# Patient Record
Sex: Male | Born: 1975 | Race: Black or African American | Hispanic: No | Marital: Married | Smoking: Never smoker
Health system: Southern US, Community
[De-identification: ages and names within clinical notes are randomized; demographics above are authoritative.]

## PROBLEM LIST (undated history)

## (undated) DIAGNOSIS — E119 Type 2 diabetes mellitus without complications: Secondary | ICD-10-CM

## (undated) DIAGNOSIS — E782 Mixed hyperlipidemia: Secondary | ICD-10-CM

## (undated) DIAGNOSIS — Z973 Presence of spectacles and contact lenses: Secondary | ICD-10-CM

## (undated) DIAGNOSIS — I10 Essential (primary) hypertension: Secondary | ICD-10-CM

## (undated) DIAGNOSIS — E1122 Type 2 diabetes mellitus with diabetic chronic kidney disease: Secondary | ICD-10-CM

## (undated) DIAGNOSIS — E7800 Pure hypercholesterolemia, unspecified: Secondary | ICD-10-CM

## (undated) DIAGNOSIS — N481 Balanitis: Secondary | ICD-10-CM

## (undated) HISTORY — PX: NO PAST SURGERIES: SHX2092

---

## 1997-04-27 HISTORY — PX: WISDOM TOOTH EXTRACTION: SHX21

## 2003-07-02 ENCOUNTER — Emergency Department (HOSPITAL_COMMUNITY): Admission: EM | Admit: 2003-07-02 | Discharge: 2003-07-02 | Payer: Self-pay | Admitting: Emergency Medicine

## 2006-05-02 ENCOUNTER — Emergency Department (HOSPITAL_COMMUNITY): Admission: EM | Admit: 2006-05-02 | Discharge: 2006-05-02 | Payer: Self-pay | Admitting: Emergency Medicine

## 2013-06-20 ENCOUNTER — Emergency Department (HOSPITAL_COMMUNITY)
Admission: EM | Admit: 2013-06-20 | Discharge: 2013-06-20 | Disposition: A | Discharge status: Home / Self Care | Payer: 59 | Attending: Emergency Medicine | Admitting: Emergency Medicine

## 2013-06-20 ENCOUNTER — Emergency Department (HOSPITAL_COMMUNITY): Payer: 59

## 2013-06-20 ENCOUNTER — Encounter (HOSPITAL_COMMUNITY): Payer: Self-pay | Admitting: Emergency Medicine

## 2013-06-20 DIAGNOSIS — E7800 Pure hypercholesterolemia, unspecified: Secondary | ICD-10-CM | POA: Insufficient documentation

## 2013-06-20 DIAGNOSIS — R05 Cough: Secondary | ICD-10-CM | POA: Insufficient documentation

## 2013-06-20 DIAGNOSIS — R059 Cough, unspecified: Secondary | ICD-10-CM | POA: Insufficient documentation

## 2013-06-20 DIAGNOSIS — R079 Chest pain, unspecified: Secondary | ICD-10-CM | POA: Insufficient documentation

## 2013-06-20 DIAGNOSIS — E119 Type 2 diabetes mellitus without complications: Secondary | ICD-10-CM | POA: Insufficient documentation

## 2013-06-20 DIAGNOSIS — Z7982 Long term (current) use of aspirin: Secondary | ICD-10-CM | POA: Insufficient documentation

## 2013-06-20 DIAGNOSIS — E785 Hyperlipidemia, unspecified: Secondary | ICD-10-CM | POA: Insufficient documentation

## 2013-06-20 DIAGNOSIS — Z79899 Other long term (current) drug therapy: Secondary | ICD-10-CM | POA: Insufficient documentation

## 2013-06-20 HISTORY — DX: Type 2 diabetes mellitus without complications: E11.9

## 2013-06-20 HISTORY — DX: Pure hypercholesterolemia, unspecified: E78.00

## 2013-06-20 LAB — I-STAT TROPONIN, ED
Troponin i, poc: 0 ng/mL (ref 0.00–0.08)
Troponin i, poc: 0.01 ng/mL (ref 0.00–0.08)

## 2013-06-20 LAB — COMPREHENSIVE METABOLIC PANEL
ALK PHOS: 73 U/L (ref 39–117)
ALT: 43 U/L (ref 0–53)
AST: 30 U/L (ref 0–37)
Albumin: 3.9 g/dL (ref 3.5–5.2)
BUN: 13 mg/dL (ref 6–23)
CO2: 23 mEq/L (ref 19–32)
Calcium: 9.2 mg/dL (ref 8.4–10.5)
Chloride: 102 mEq/L (ref 96–112)
Creatinine, Ser: 0.86 mg/dL (ref 0.50–1.35)
GFR calc non Af Amer: >90 mL/min (ref >90)
GLUCOSE: 129 mg/dL — AB (ref 70–99)
POTASSIUM: 4.7 meq/L (ref 3.7–5.3)
SODIUM: 140 meq/L (ref 137–147)
TOTAL PROTEIN: 7.7 g/dL (ref 6.0–8.3)
Total Bilirubin: 0.2 mg/dL — ABNORMAL LOW (ref 0.3–1.2)

## 2013-06-20 LAB — CBC WITH DIFFERENTIAL/PLATELET
BASOS ABS: 0.1 10*3/uL (ref 0.0–0.1)
Basophils Relative: 1 % (ref 0–1)
EOS ABS: 0.4 10*3/uL (ref 0.0–0.7)
Eosinophils Relative: 4 % (ref 0–5)
HEMATOCRIT: 41.6 % (ref 39.0–52.0)
HEMOGLOBIN: 13.8 g/dL (ref 13.0–17.0)
Lymphocytes Relative: 19 % (ref 12–46)
Lymphs Abs: 1.7 10*3/uL (ref 0.7–4.0)
MCH: 25.7 pg — AB (ref 26.0–34.0)
MCHC: 33.2 g/dL (ref 30.0–36.0)
MCV: 77.6 fL — AB (ref 78.0–100.0)
MONOS PCT: 7 % (ref 3–12)
Monocytes Absolute: 0.7 10*3/uL (ref 0.1–1.0)
NEUTROS ABS: 6 10*3/uL (ref 1.7–7.7)
Neutrophils Relative %: 68 % (ref 43–77)
Platelets: 308 10*3/uL (ref 150–400)
RBC: 5.36 MIL/uL (ref 4.22–5.81)
RDW: 15.3 % (ref 11.5–15.5)
WBC: 8.8 10*3/uL (ref 4.0–10.5)

## 2013-06-20 LAB — CK TOTAL AND CKMB (NOT AT ARMC)
CK TOTAL: 226 U/L (ref 7–232)
CK, MB: 2 ng/mL (ref 0.3–4.0)
Relative Index: 0.9 (ref 0.0–2.5)

## 2013-06-20 MED ORDER — IPRATROPIUM BROMIDE 0.02 % IN SOLN
0.5000 mg | Freq: Once | RESPIRATORY_TRACT | Status: AC
  Administered 2013-06-20: 0.5 mg via RESPIRATORY_TRACT
  Filled 2013-06-20: qty 2.5

## 2013-06-20 MED ORDER — SODIUM CHLORIDE 0.9 % IV BOLUS (SEPSIS)
1000.0000 mL | Freq: Once | INTRAVENOUS | Status: AC
  Administered 2013-06-20: 1000 mL via INTRAVENOUS

## 2013-06-20 MED ORDER — KETOROLAC TROMETHAMINE 30 MG/ML IJ SOLN
30.0000 mg | Freq: Once | INTRAMUSCULAR | Status: AC
  Administered 2013-06-20: 30 mg via INTRAVENOUS
  Filled 2013-06-20: qty 1

## 2013-06-20 MED ORDER — ALBUTEROL SULFATE (2.5 MG/3ML) 0.083% IN NEBU
5.0000 mg | INHALATION_SOLUTION | Freq: Once | RESPIRATORY_TRACT | Status: AC
  Administered 2013-06-20: 5 mg via RESPIRATORY_TRACT
  Filled 2013-06-20: qty 6

## 2013-06-20 NOTE — ED Notes (Addendum)
To ED via [facility] from home with chest pain that started last night at 2am== woke patient up, but was able to go back to sleep. Was still present at 5:30am-- took a shower, then had an episode of coughing. Pt took ASA 325mg  x 2 this am. Received NTG x 3per EMS-- brought pain from 5/10 to 4/10. IV Left Hand-- 20 G per EMS

## 2013-06-20 NOTE — ED Notes (Signed)
MD at bedside. 

## 2013-06-20 NOTE — ED Provider Notes (Signed)
CSN: [id number]     Arrival date & time 06/20/13  0815 History   First MD Initiated Contact with Patient 06/20/13 0818     Chief Complaint  Patient presents with  . Chest Pain     (Consider location/radiation/quality/duration/timing/severity/associated sxs/prior Treatment) The history is provided by the patient.  Jene EveryJeremy L Dismuke is a 38 y.o. male hx of prediabetes not on meds, HL here with chest pain. Substernal chest pain, dull ache, since 2 am this morning. It woke him up from sleep. Took ASA 325 mg x 2 with minimal relief. Has some coughing afterwards. Denies fevers. Denies abdominal pain or vomiting. Given nitro by EMS and pain is now 4/10. Denies smoking, no hx of CAD.     Past Medical History  Diagnosis Date  . Diabetes     type II-- controlled with diet  . High cholesterol    History reviewed. No pertinent past surgical history. History reviewed. No pertinent family history. History  Substance Use Topics  . Smoking status: Never Smoker   . Smokeless tobacco: Not on file  . Alcohol Use: No    Review of Systems  Cardiovascular: Positive for chest pain.  All other systems reviewed and are negative.      Allergies  Review of patient's allergies indicates no known allergies.  Home Medications   Current Outpatient Rx  Name  Route  Sig  Dispense  Refill  . aspirin EC 81 MG tablet   Oral   Take 81 mg by mouth at bedtime.         Marland Kitchen. atorvastatin (LIPITOR) 20 MG tablet   Oral   Take 20 mg by mouth every evening.         . Omega-3 Fatty Acids (FISH OIL) 1000 MG CAPS   Oral   Take 1,000 mg by mouth 3 (three) times daily with meals.          BP 126/74  Pulse 80  Temp(Src) 98.3 F (36.8 C) (Oral)  Resp 23  SpO2 97% Physical Exam  Nursing note and vitals reviewed. Constitutional: He is oriented to person, place, and time. He appears well-developed and well-nourished.  Slightly uncomfortable   HENT:  Head: Normocephalic.  Mouth/Throat: Oropharynx is  clear and moist.  Eyes: Conjunctivae are normal. Pupils are equal, round, and reactive to light.  Neck: Normal range of motion. Neck supple.  Cardiovascular: Normal rate, regular rhythm and normal heart sounds.   Pulmonary/Chest: Effort normal.  Decreased air movement. ? Upper lobe wheezing, no crackles   Abdominal: Soft. Bowel sounds are normal. He exhibits no distension. There is no tenderness. There is no rebound and no guarding.  Musculoskeletal: Normal range of motion. He exhibits no edema and no tenderness.  Neurological: He is alert and oriented to person, place, and time. No cranial nerve deficit. Coordination normal.  Skin: Skin is warm and dry.  Psychiatric: He has a normal mood and affect. His behavior is normal. Judgment and thought content normal.    ED Course  Procedures (including critical care time)    EMERGENCY DEPARTMENT US CARDIAC EXAM "Study: Limited Ultrasound of the heart and pericardium"  INDICATIONS:Dyspnea Multiple views of the heart and pericardium are obtained with a multi-frequency probe.  PERFORMED WU:[id number]:Myself  IMAGES ARCHIVED?: Yes  FINDINGS: No pericardial effusion and Normal contractility  LIMITATIONS:  Body habitus  VIEWS USED: Subcostal 4 chamber, Parasternal long axis and Parasternal short axis  INTERPRETATION: Cardiac activity present and Pericardial effusioin absent  COMMENT:  No  pericardial effusion    Labs Review Labs Reviewed  CBC WITH DIFFERENTIAL - Abnormal; Notable for the following:    MCV 77.6 (*)    MCH 25.7 (*)    All other components within normal limits  COMPREHENSIVE METABOLIC PANEL - Abnormal; Notable for the following:    Glucose, Bld 129 (*)    Total Bilirubin 0.2 (*)    All other components within normal limits  CK TOTAL AND CKMB  I-STAT TROPOININ, ED  I-STAT TROPOININ, ED   Imaging Review Dg Chest Portable 1 View  06/20/2013   CLINICAL DATA:  Chest pain, shortness of Breath  EXAM: PORTABLE CHEST - 1 VIEW   COMPARISON:  None.  FINDINGS: Cardiomediastinal silhouette is unremarkable. No acute infiltrate or pleural effusion. No pulmonary edema. Bony thorax is unremarkable.  IMPRESSION: No active disease.   Electronically Signed   By: Natasha Mead M.D.   On: 06/20/2013 08:43    EKG Interpretation    Date/Time:  Tuesday June 20 2013 08:21:56 EST Ventricular Rate:  96 PR Interval:  163 QRS Duration: 88 QT Interval:  355 QTC Calculation: 449 R Axis:   36 Text Interpretation:  Sinus rhythm Borderline T abnormalities, diffuse leads No previous ECGs available Confirmed by YAO  MD, DAVID [phone]) on 06/20/2013 8:24:50 AM            MDM   Final diagnoses:  None   KRISTION HOLIFIELD is a 38 y.o. male here with chest pain. Low risk for ACS. PERC neg for PE. EKG showed diffuse TWI. No pericardial rub but I am considering possible pericarditis. Also mild wheezing so bronchitis possible. Will get labs, CK, trop x 2, nebs, cxr.   9 AM  US showed no pericardial effusion.   12:26 PM Pain free after toradol. Trop neg x 2. CK nl. I doubt ACS. Can be mild pericarditis. Recommend NSAIDs and he has f/u outpatient.     Richardean Canal, MD 06/20/13 [phone]

## 2013-06-20 NOTE — Discharge Instructions (Signed)
Take motrin 800 mg every 6 hrs for pain.   Follow up with your doctor. You may need further testing if you still have pain in several days.   Return to ER if you have fever, severe pain, shortness of breath.

## 2015-08-09 IMAGING — DX DG CHEST 1V PORT
1 series · 1 of 1 positions shown · non-contrast
Comparison: None.

CLINICAL DATA: Chest pain, shortness of Breath

EXAM:
PORTABLE CHEST - 1 VIEW

[portable]
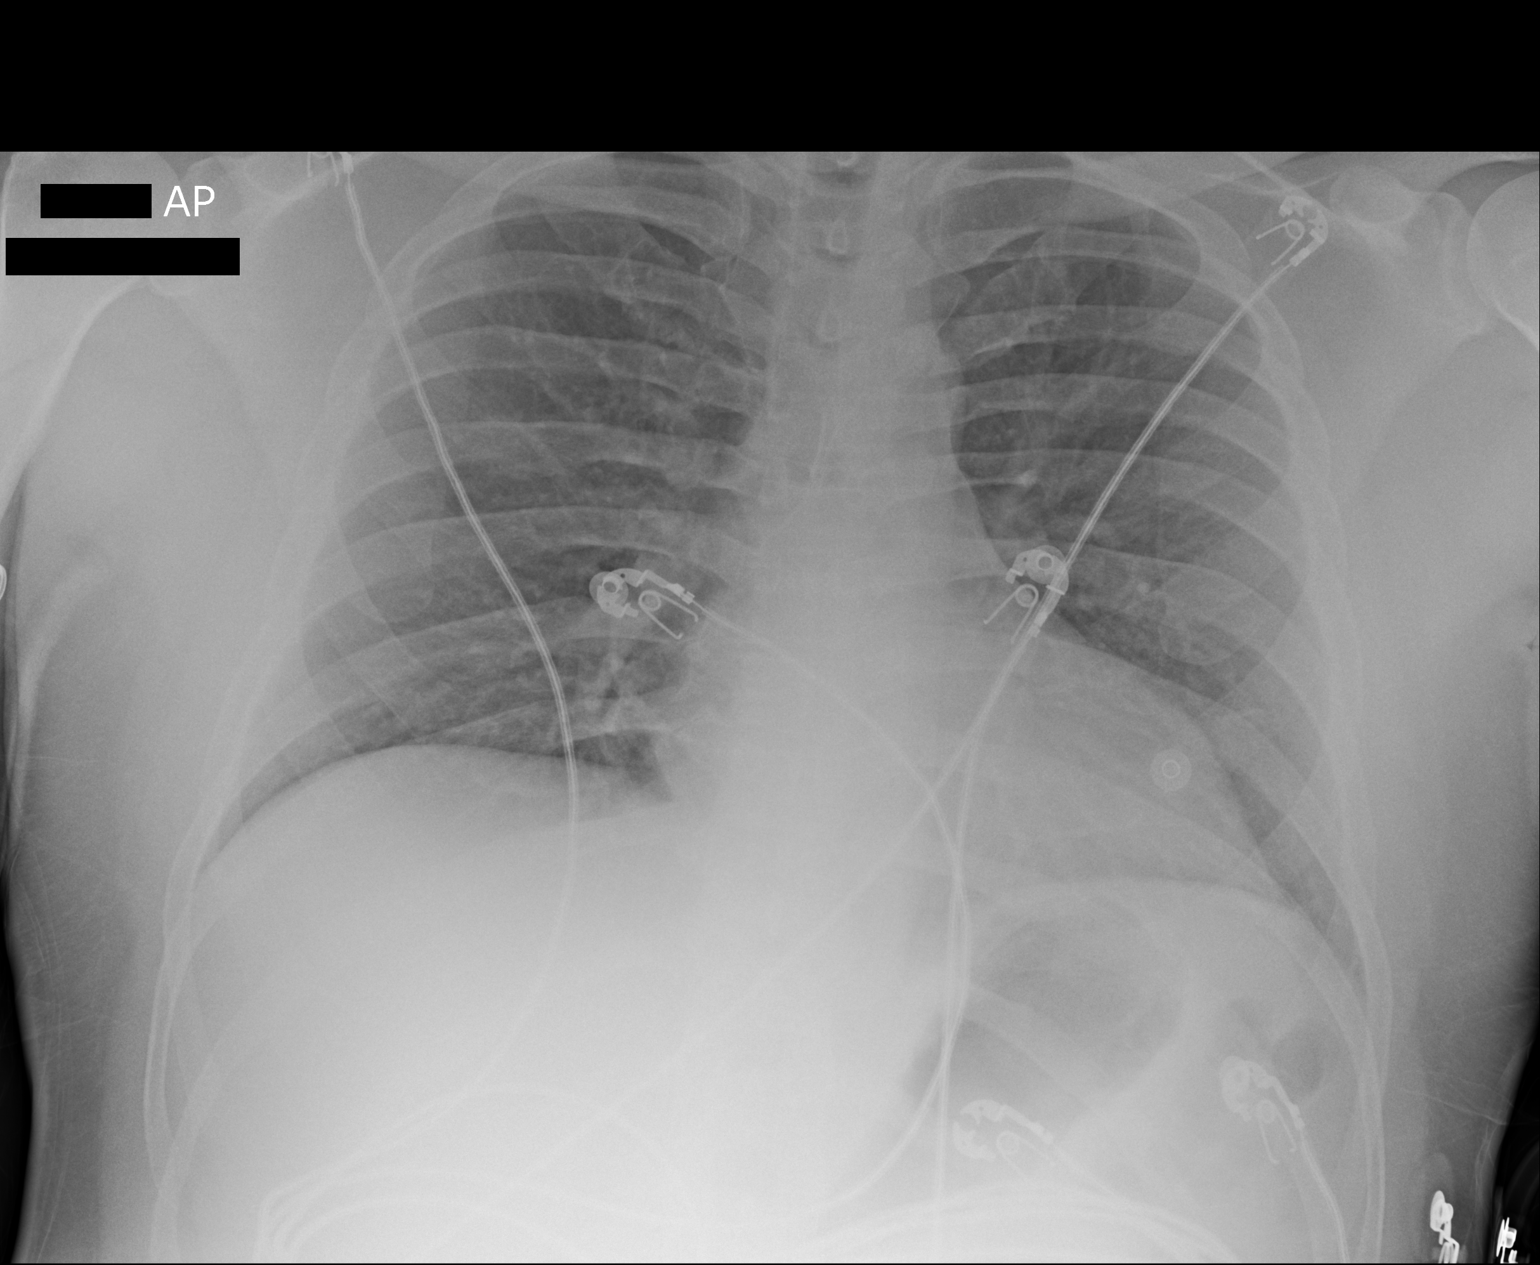

[1 of 1 positions shown; findings below may reference images not displayed]

FINDINGS: Cardiomediastinal silhouette is unremarkable. No acute infiltrate or
pleural effusion. No pulmonary edema. Bony thorax is unremarkable.
IMPRESSION: No active disease.

## 2016-11-02 DIAGNOSIS — E782 Mixed hyperlipidemia: Secondary | ICD-10-CM | POA: Diagnosis not present

## 2016-11-02 DIAGNOSIS — I10 Essential (primary) hypertension: Secondary | ICD-10-CM | POA: Diagnosis not present

## 2016-11-02 DIAGNOSIS — Z79899 Other long term (current) drug therapy: Secondary | ICD-10-CM | POA: Diagnosis not present

## 2016-11-02 DIAGNOSIS — E113293 Type 2 diabetes mellitus with mild nonproliferative diabetic retinopathy without macular edema, bilateral: Secondary | ICD-10-CM | POA: Diagnosis not present

## 2016-11-02 DIAGNOSIS — E113291 Type 2 diabetes mellitus with mild nonproliferative diabetic retinopathy without macular edema, right eye: Secondary | ICD-10-CM | POA: Diagnosis not present

## 2016-11-26 DIAGNOSIS — E1122 Type 2 diabetes mellitus with diabetic chronic kidney disease: Secondary | ICD-10-CM | POA: Diagnosis not present

## 2017-02-11 DIAGNOSIS — E1165 Type 2 diabetes mellitus with hyperglycemia: Secondary | ICD-10-CM | POA: Diagnosis not present

## 2017-02-11 DIAGNOSIS — E1122 Type 2 diabetes mellitus with diabetic chronic kidney disease: Secondary | ICD-10-CM | POA: Diagnosis not present

## 2017-02-11 DIAGNOSIS — I10 Essential (primary) hypertension: Secondary | ICD-10-CM | POA: Diagnosis not present

## 2017-02-11 DIAGNOSIS — E782 Mixed hyperlipidemia: Secondary | ICD-10-CM | POA: Diagnosis not present

## 2017-05-18 DIAGNOSIS — I10 Essential (primary) hypertension: Secondary | ICD-10-CM | POA: Diagnosis not present

## 2017-07-12 DIAGNOSIS — E1165 Type 2 diabetes mellitus with hyperglycemia: Secondary | ICD-10-CM | POA: Diagnosis not present

## 2017-07-12 DIAGNOSIS — E1122 Type 2 diabetes mellitus with diabetic chronic kidney disease: Secondary | ICD-10-CM | POA: Diagnosis not present

## 2017-08-25 DIAGNOSIS — F3289 Other specified depressive episodes: Secondary | ICD-10-CM | POA: Diagnosis not present

## 2017-08-25 DIAGNOSIS — F438 Other reactions to severe stress: Secondary | ICD-10-CM | POA: Diagnosis not present

## 2017-08-25 DIAGNOSIS — F419 Anxiety disorder, unspecified: Secondary | ICD-10-CM | POA: Diagnosis not present

## 2017-09-02 DIAGNOSIS — F419 Anxiety disorder, unspecified: Secondary | ICD-10-CM | POA: Diagnosis not present

## 2017-09-02 DIAGNOSIS — F438 Other reactions to severe stress: Secondary | ICD-10-CM | POA: Diagnosis not present

## 2017-09-02 DIAGNOSIS — F3289 Other specified depressive episodes: Secondary | ICD-10-CM | POA: Diagnosis not present

## 2017-09-23 DIAGNOSIS — F419 Anxiety disorder, unspecified: Secondary | ICD-10-CM | POA: Diagnosis not present

## 2017-09-23 DIAGNOSIS — F438 Other reactions to severe stress: Secondary | ICD-10-CM | POA: Diagnosis not present

## 2017-09-23 DIAGNOSIS — F3289 Other specified depressive episodes: Secondary | ICD-10-CM | POA: Diagnosis not present

## 2017-10-07 DIAGNOSIS — F3289 Other specified depressive episodes: Secondary | ICD-10-CM | POA: Diagnosis not present

## 2017-10-07 DIAGNOSIS — F438 Other reactions to severe stress: Secondary | ICD-10-CM | POA: Diagnosis not present

## 2017-10-07 DIAGNOSIS — F419 Anxiety disorder, unspecified: Secondary | ICD-10-CM | POA: Diagnosis not present

## 2017-10-14 DIAGNOSIS — F438 Other reactions to severe stress: Secondary | ICD-10-CM | POA: Diagnosis not present

## 2017-10-14 DIAGNOSIS — F419 Anxiety disorder, unspecified: Secondary | ICD-10-CM | POA: Diagnosis not present

## 2017-10-14 DIAGNOSIS — F3289 Other specified depressive episodes: Secondary | ICD-10-CM | POA: Diagnosis not present

## 2017-10-29 DIAGNOSIS — F3289 Other specified depressive episodes: Secondary | ICD-10-CM | POA: Diagnosis not present

## 2017-10-29 DIAGNOSIS — F438 Other reactions to severe stress: Secondary | ICD-10-CM | POA: Diagnosis not present

## 2017-10-29 DIAGNOSIS — F419 Anxiety disorder, unspecified: Secondary | ICD-10-CM | POA: Diagnosis not present

## 2017-11-05 DIAGNOSIS — F3289 Other specified depressive episodes: Secondary | ICD-10-CM | POA: Diagnosis not present

## 2017-11-05 DIAGNOSIS — F438 Other reactions to severe stress: Secondary | ICD-10-CM | POA: Diagnosis not present

## 2017-11-05 DIAGNOSIS — F419 Anxiety disorder, unspecified: Secondary | ICD-10-CM | POA: Diagnosis not present

## 2017-11-12 DIAGNOSIS — F438 Other reactions to severe stress: Secondary | ICD-10-CM | POA: Diagnosis not present

## 2017-11-12 DIAGNOSIS — F3289 Other specified depressive episodes: Secondary | ICD-10-CM | POA: Diagnosis not present

## 2017-11-12 DIAGNOSIS — F419 Anxiety disorder, unspecified: Secondary | ICD-10-CM | POA: Diagnosis not present

## 2017-11-18 DIAGNOSIS — F419 Anxiety disorder, unspecified: Secondary | ICD-10-CM | POA: Diagnosis not present

## 2017-11-18 DIAGNOSIS — F3289 Other specified depressive episodes: Secondary | ICD-10-CM | POA: Diagnosis not present

## 2017-11-18 DIAGNOSIS — F438 Other reactions to severe stress: Secondary | ICD-10-CM | POA: Diagnosis not present

## 2017-12-14 DIAGNOSIS — E1122 Type 2 diabetes mellitus with diabetic chronic kidney disease: Secondary | ICD-10-CM | POA: Diagnosis not present

## 2017-12-14 DIAGNOSIS — Z79899 Other long term (current) drug therapy: Secondary | ICD-10-CM | POA: Diagnosis not present

## 2017-12-14 DIAGNOSIS — Z23 Encounter for immunization: Secondary | ICD-10-CM | POA: Diagnosis not present

## 2017-12-14 DIAGNOSIS — E782 Mixed hyperlipidemia: Secondary | ICD-10-CM | POA: Diagnosis not present

## 2017-12-14 DIAGNOSIS — Z0000 Encounter for general adult medical examination without abnormal findings: Secondary | ICD-10-CM | POA: Diagnosis not present

## 2017-12-14 DIAGNOSIS — Z125 Encounter for screening for malignant neoplasm of prostate: Secondary | ICD-10-CM | POA: Diagnosis not present

## 2018-01-17 DIAGNOSIS — F3289 Other specified depressive episodes: Secondary | ICD-10-CM | POA: Diagnosis not present

## 2018-01-17 DIAGNOSIS — F419 Anxiety disorder, unspecified: Secondary | ICD-10-CM | POA: Diagnosis not present

## 2018-01-17 DIAGNOSIS — F438 Other reactions to severe stress: Secondary | ICD-10-CM | POA: Diagnosis not present

## 2018-01-31 DIAGNOSIS — F3289 Other specified depressive episodes: Secondary | ICD-10-CM | POA: Diagnosis not present

## 2018-01-31 DIAGNOSIS — F419 Anxiety disorder, unspecified: Secondary | ICD-10-CM | POA: Diagnosis not present

## 2018-01-31 DIAGNOSIS — F438 Other reactions to severe stress: Secondary | ICD-10-CM | POA: Diagnosis not present

## 2018-02-14 DIAGNOSIS — F3289 Other specified depressive episodes: Secondary | ICD-10-CM | POA: Diagnosis not present

## 2018-02-14 DIAGNOSIS — F438 Other reactions to severe stress: Secondary | ICD-10-CM | POA: Diagnosis not present

## 2018-02-14 DIAGNOSIS — F419 Anxiety disorder, unspecified: Secondary | ICD-10-CM | POA: Diagnosis not present

## 2018-02-28 DIAGNOSIS — F3289 Other specified depressive episodes: Secondary | ICD-10-CM | POA: Diagnosis not present

## 2018-02-28 DIAGNOSIS — F438 Other reactions to severe stress: Secondary | ICD-10-CM | POA: Diagnosis not present

## 2018-02-28 DIAGNOSIS — F419 Anxiety disorder, unspecified: Secondary | ICD-10-CM | POA: Diagnosis not present

## 2018-03-10 DIAGNOSIS — E113292 Type 2 diabetes mellitus with mild nonproliferative diabetic retinopathy without macular edema, left eye: Secondary | ICD-10-CM | POA: Diagnosis not present

## 2018-03-14 DIAGNOSIS — F3289 Other specified depressive episodes: Secondary | ICD-10-CM | POA: Diagnosis not present

## 2018-03-14 DIAGNOSIS — F419 Anxiety disorder, unspecified: Secondary | ICD-10-CM | POA: Diagnosis not present

## 2018-03-14 DIAGNOSIS — F438 Other reactions to severe stress: Secondary | ICD-10-CM | POA: Diagnosis not present

## 2018-03-29 DIAGNOSIS — F438 Other reactions to severe stress: Secondary | ICD-10-CM | POA: Diagnosis not present

## 2018-03-29 DIAGNOSIS — F3289 Other specified depressive episodes: Secondary | ICD-10-CM | POA: Diagnosis not present

## 2018-03-29 DIAGNOSIS — F419 Anxiety disorder, unspecified: Secondary | ICD-10-CM | POA: Diagnosis not present

## 2018-04-12 DIAGNOSIS — F419 Anxiety disorder, unspecified: Secondary | ICD-10-CM | POA: Diagnosis not present

## 2018-04-12 DIAGNOSIS — F3289 Other specified depressive episodes: Secondary | ICD-10-CM | POA: Diagnosis not present

## 2018-04-12 DIAGNOSIS — F438 Other reactions to severe stress: Secondary | ICD-10-CM | POA: Diagnosis not present

## 2018-05-25 DIAGNOSIS — F419 Anxiety disorder, unspecified: Secondary | ICD-10-CM | POA: Diagnosis not present

## 2018-05-25 DIAGNOSIS — F3289 Other specified depressive episodes: Secondary | ICD-10-CM | POA: Diagnosis not present

## 2018-05-25 DIAGNOSIS — F438 Other reactions to severe stress: Secondary | ICD-10-CM | POA: Diagnosis not present

## 2018-06-07 DIAGNOSIS — F3289 Other specified depressive episodes: Secondary | ICD-10-CM | POA: Diagnosis not present

## 2018-06-07 DIAGNOSIS — F419 Anxiety disorder, unspecified: Secondary | ICD-10-CM | POA: Diagnosis not present

## 2018-06-07 DIAGNOSIS — F438 Other reactions to severe stress: Secondary | ICD-10-CM | POA: Diagnosis not present

## 2018-06-20 DIAGNOSIS — F438 Other reactions to severe stress: Secondary | ICD-10-CM | POA: Diagnosis not present

## 2018-06-20 DIAGNOSIS — F419 Anxiety disorder, unspecified: Secondary | ICD-10-CM | POA: Diagnosis not present

## 2018-06-20 DIAGNOSIS — F3289 Other specified depressive episodes: Secondary | ICD-10-CM | POA: Diagnosis not present

## 2018-07-04 DIAGNOSIS — F3289 Other specified depressive episodes: Secondary | ICD-10-CM | POA: Diagnosis not present

## 2018-07-04 DIAGNOSIS — F419 Anxiety disorder, unspecified: Secondary | ICD-10-CM | POA: Diagnosis not present

## 2018-07-04 DIAGNOSIS — F438 Other reactions to severe stress: Secondary | ICD-10-CM | POA: Diagnosis not present

## 2018-07-25 DIAGNOSIS — F438 Other reactions to severe stress: Secondary | ICD-10-CM | POA: Diagnosis not present

## 2018-07-25 DIAGNOSIS — F419 Anxiety disorder, unspecified: Secondary | ICD-10-CM | POA: Diagnosis not present

## 2018-07-25 DIAGNOSIS — F3289 Other specified depressive episodes: Secondary | ICD-10-CM | POA: Diagnosis not present

## 2018-08-08 DIAGNOSIS — F3289 Other specified depressive episodes: Secondary | ICD-10-CM | POA: Diagnosis not present

## 2018-08-08 DIAGNOSIS — F419 Anxiety disorder, unspecified: Secondary | ICD-10-CM | POA: Diagnosis not present

## 2018-08-08 DIAGNOSIS — F438 Other reactions to severe stress: Secondary | ICD-10-CM | POA: Diagnosis not present

## 2018-08-23 DIAGNOSIS — F3289 Other specified depressive episodes: Secondary | ICD-10-CM | POA: Diagnosis not present

## 2018-08-23 DIAGNOSIS — F438 Other reactions to severe stress: Secondary | ICD-10-CM | POA: Diagnosis not present

## 2018-08-23 DIAGNOSIS — F419 Anxiety disorder, unspecified: Secondary | ICD-10-CM | POA: Diagnosis not present

## 2018-09-27 DIAGNOSIS — F3289 Other specified depressive episodes: Secondary | ICD-10-CM | POA: Diagnosis not present

## 2018-09-27 DIAGNOSIS — F419 Anxiety disorder, unspecified: Secondary | ICD-10-CM | POA: Diagnosis not present

## 2018-09-27 DIAGNOSIS — F438 Other reactions to severe stress: Secondary | ICD-10-CM | POA: Diagnosis not present

## 2018-10-10 DIAGNOSIS — F419 Anxiety disorder, unspecified: Secondary | ICD-10-CM | POA: Diagnosis not present

## 2018-10-10 DIAGNOSIS — F3289 Other specified depressive episodes: Secondary | ICD-10-CM | POA: Diagnosis not present

## 2018-10-10 DIAGNOSIS — F438 Other reactions to severe stress: Secondary | ICD-10-CM | POA: Diagnosis not present

## 2018-10-24 DIAGNOSIS — F3289 Other specified depressive episodes: Secondary | ICD-10-CM | POA: Diagnosis not present

## 2018-10-24 DIAGNOSIS — F419 Anxiety disorder, unspecified: Secondary | ICD-10-CM | POA: Diagnosis not present

## 2018-10-24 DIAGNOSIS — F438 Other reactions to severe stress: Secondary | ICD-10-CM | POA: Diagnosis not present

## 2018-11-07 DIAGNOSIS — F3289 Other specified depressive episodes: Secondary | ICD-10-CM | POA: Diagnosis not present

## 2018-11-07 DIAGNOSIS — F438 Other reactions to severe stress: Secondary | ICD-10-CM | POA: Diagnosis not present

## 2018-11-07 DIAGNOSIS — F419 Anxiety disorder, unspecified: Secondary | ICD-10-CM | POA: Diagnosis not present

## 2018-11-21 DIAGNOSIS — F438 Other reactions to severe stress: Secondary | ICD-10-CM | POA: Diagnosis not present

## 2018-11-21 DIAGNOSIS — F419 Anxiety disorder, unspecified: Secondary | ICD-10-CM | POA: Diagnosis not present

## 2018-11-21 DIAGNOSIS — F3289 Other specified depressive episodes: Secondary | ICD-10-CM | POA: Diagnosis not present

## 2018-12-05 DIAGNOSIS — F3289 Other specified depressive episodes: Secondary | ICD-10-CM | POA: Diagnosis not present

## 2018-12-05 DIAGNOSIS — F438 Other reactions to severe stress: Secondary | ICD-10-CM | POA: Diagnosis not present

## 2018-12-05 DIAGNOSIS — F419 Anxiety disorder, unspecified: Secondary | ICD-10-CM | POA: Diagnosis not present

## 2018-12-19 DIAGNOSIS — F3289 Other specified depressive episodes: Secondary | ICD-10-CM | POA: Diagnosis not present

## 2018-12-19 DIAGNOSIS — F438 Other reactions to severe stress: Secondary | ICD-10-CM | POA: Diagnosis not present

## 2018-12-19 DIAGNOSIS — F419 Anxiety disorder, unspecified: Secondary | ICD-10-CM | POA: Diagnosis not present

## 2019-01-03 DIAGNOSIS — F438 Other reactions to severe stress: Secondary | ICD-10-CM | POA: Diagnosis not present

## 2019-01-03 DIAGNOSIS — F3289 Other specified depressive episodes: Secondary | ICD-10-CM | POA: Diagnosis not present

## 2019-01-03 DIAGNOSIS — F419 Anxiety disorder, unspecified: Secondary | ICD-10-CM | POA: Diagnosis not present

## 2019-01-16 DIAGNOSIS — F3289 Other specified depressive episodes: Secondary | ICD-10-CM | POA: Diagnosis not present

## 2019-01-16 DIAGNOSIS — F438 Other reactions to severe stress: Secondary | ICD-10-CM | POA: Diagnosis not present

## 2019-01-16 DIAGNOSIS — F419 Anxiety disorder, unspecified: Secondary | ICD-10-CM | POA: Diagnosis not present

## 2019-02-06 DIAGNOSIS — F419 Anxiety disorder, unspecified: Secondary | ICD-10-CM | POA: Diagnosis not present

## 2019-02-06 DIAGNOSIS — F438 Other reactions to severe stress: Secondary | ICD-10-CM | POA: Diagnosis not present

## 2019-02-06 DIAGNOSIS — F3289 Other specified depressive episodes: Secondary | ICD-10-CM | POA: Diagnosis not present

## 2019-02-13 DIAGNOSIS — Z79899 Other long term (current) drug therapy: Secondary | ICD-10-CM | POA: Diagnosis not present

## 2019-02-13 DIAGNOSIS — E782 Mixed hyperlipidemia: Secondary | ICD-10-CM | POA: Diagnosis not present

## 2019-02-13 DIAGNOSIS — I10 Essential (primary) hypertension: Secondary | ICD-10-CM | POA: Diagnosis not present

## 2019-02-13 DIAGNOSIS — Z0000 Encounter for general adult medical examination without abnormal findings: Secondary | ICD-10-CM | POA: Diagnosis not present

## 2019-02-13 DIAGNOSIS — E1122 Type 2 diabetes mellitus with diabetic chronic kidney disease: Secondary | ICD-10-CM | POA: Diagnosis not present

## 2019-02-20 DIAGNOSIS — F419 Anxiety disorder, unspecified: Secondary | ICD-10-CM | POA: Diagnosis not present

## 2019-02-20 DIAGNOSIS — F438 Other reactions to severe stress: Secondary | ICD-10-CM | POA: Diagnosis not present

## 2019-02-20 DIAGNOSIS — F3289 Other specified depressive episodes: Secondary | ICD-10-CM | POA: Diagnosis not present

## 2019-03-07 DIAGNOSIS — F3289 Other specified depressive episodes: Secondary | ICD-10-CM | POA: Diagnosis not present

## 2019-03-07 DIAGNOSIS — F419 Anxiety disorder, unspecified: Secondary | ICD-10-CM | POA: Diagnosis not present

## 2019-03-07 DIAGNOSIS — F438 Other reactions to severe stress: Secondary | ICD-10-CM | POA: Diagnosis not present

## 2019-03-22 DIAGNOSIS — Z20828 Contact with and (suspected) exposure to other viral communicable diseases: Secondary | ICD-10-CM | POA: Diagnosis not present

## 2019-03-29 DIAGNOSIS — F3289 Other specified depressive episodes: Secondary | ICD-10-CM | POA: Diagnosis not present

## 2019-03-29 DIAGNOSIS — F419 Anxiety disorder, unspecified: Secondary | ICD-10-CM | POA: Diagnosis not present

## 2019-03-29 DIAGNOSIS — F438 Other reactions to severe stress: Secondary | ICD-10-CM | POA: Diagnosis not present

## 2019-04-11 DIAGNOSIS — F3289 Other specified depressive episodes: Secondary | ICD-10-CM | POA: Diagnosis not present

## 2019-04-11 DIAGNOSIS — F438 Other reactions to severe stress: Secondary | ICD-10-CM | POA: Diagnosis not present

## 2019-04-11 DIAGNOSIS — F419 Anxiety disorder, unspecified: Secondary | ICD-10-CM | POA: Diagnosis not present

## 2019-05-03 DIAGNOSIS — F419 Anxiety disorder, unspecified: Secondary | ICD-10-CM | POA: Diagnosis not present

## 2019-05-03 DIAGNOSIS — F438 Other reactions to severe stress: Secondary | ICD-10-CM | POA: Diagnosis not present

## 2019-05-03 DIAGNOSIS — F3289 Other specified depressive episodes: Secondary | ICD-10-CM | POA: Diagnosis not present

## 2019-08-07 DIAGNOSIS — F3289 Other specified depressive episodes: Secondary | ICD-10-CM | POA: Diagnosis not present

## 2019-08-07 DIAGNOSIS — F419 Anxiety disorder, unspecified: Secondary | ICD-10-CM | POA: Diagnosis not present

## 2019-08-07 DIAGNOSIS — F438 Other reactions to severe stress: Secondary | ICD-10-CM | POA: Diagnosis not present

## 2019-09-04 DIAGNOSIS — F419 Anxiety disorder, unspecified: Secondary | ICD-10-CM | POA: Diagnosis not present

## 2019-09-04 DIAGNOSIS — F438 Other reactions to severe stress: Secondary | ICD-10-CM | POA: Diagnosis not present

## 2019-09-04 DIAGNOSIS — F3289 Other specified depressive episodes: Secondary | ICD-10-CM | POA: Diagnosis not present

## 2019-10-04 DIAGNOSIS — F438 Other reactions to severe stress: Secondary | ICD-10-CM | POA: Diagnosis not present

## 2019-10-04 DIAGNOSIS — F419 Anxiety disorder, unspecified: Secondary | ICD-10-CM | POA: Diagnosis not present

## 2019-10-04 DIAGNOSIS — F3289 Other specified depressive episodes: Secondary | ICD-10-CM | POA: Diagnosis not present

## 2019-10-23 DIAGNOSIS — F419 Anxiety disorder, unspecified: Secondary | ICD-10-CM | POA: Diagnosis not present

## 2019-10-23 DIAGNOSIS — F3289 Other specified depressive episodes: Secondary | ICD-10-CM | POA: Diagnosis not present

## 2019-10-23 DIAGNOSIS — F438 Other reactions to severe stress: Secondary | ICD-10-CM | POA: Diagnosis not present

## 2019-11-13 DIAGNOSIS — F3289 Other specified depressive episodes: Secondary | ICD-10-CM | POA: Diagnosis not present

## 2019-11-13 DIAGNOSIS — F438 Other reactions to severe stress: Secondary | ICD-10-CM | POA: Diagnosis not present

## 2019-11-13 DIAGNOSIS — F419 Anxiety disorder, unspecified: Secondary | ICD-10-CM | POA: Diagnosis not present

## 2019-11-27 DIAGNOSIS — F3289 Other specified depressive episodes: Secondary | ICD-10-CM | POA: Diagnosis not present

## 2019-11-27 DIAGNOSIS — F419 Anxiety disorder, unspecified: Secondary | ICD-10-CM | POA: Diagnosis not present

## 2019-11-27 DIAGNOSIS — F438 Other reactions to severe stress: Secondary | ICD-10-CM | POA: Diagnosis not present

## 2019-12-11 DIAGNOSIS — F3289 Other specified depressive episodes: Secondary | ICD-10-CM | POA: Diagnosis not present

## 2019-12-11 DIAGNOSIS — F419 Anxiety disorder, unspecified: Secondary | ICD-10-CM | POA: Diagnosis not present

## 2019-12-11 DIAGNOSIS — F438 Other reactions to severe stress: Secondary | ICD-10-CM | POA: Diagnosis not present

## 2019-12-25 DIAGNOSIS — F3289 Other specified depressive episodes: Secondary | ICD-10-CM | POA: Diagnosis not present

## 2019-12-25 DIAGNOSIS — F438 Other reactions to severe stress: Secondary | ICD-10-CM | POA: Diagnosis not present

## 2019-12-25 DIAGNOSIS — F419 Anxiety disorder, unspecified: Secondary | ICD-10-CM | POA: Diagnosis not present

## 2020-01-08 DIAGNOSIS — F438 Other reactions to severe stress: Secondary | ICD-10-CM | POA: Diagnosis not present

## 2020-01-08 DIAGNOSIS — F3289 Other specified depressive episodes: Secondary | ICD-10-CM | POA: Diagnosis not present

## 2020-01-08 DIAGNOSIS — F419 Anxiety disorder, unspecified: Secondary | ICD-10-CM | POA: Diagnosis not present

## 2020-01-22 DIAGNOSIS — F419 Anxiety disorder, unspecified: Secondary | ICD-10-CM | POA: Diagnosis not present

## 2020-01-22 DIAGNOSIS — F438 Other reactions to severe stress: Secondary | ICD-10-CM | POA: Diagnosis not present

## 2020-01-22 DIAGNOSIS — F3289 Other specified depressive episodes: Secondary | ICD-10-CM | POA: Diagnosis not present

## 2020-02-05 DIAGNOSIS — F3289 Other specified depressive episodes: Secondary | ICD-10-CM | POA: Diagnosis not present

## 2020-02-05 DIAGNOSIS — F419 Anxiety disorder, unspecified: Secondary | ICD-10-CM | POA: Diagnosis not present

## 2020-02-05 DIAGNOSIS — F438 Other reactions to severe stress: Secondary | ICD-10-CM | POA: Diagnosis not present

## 2020-02-26 DIAGNOSIS — F3289 Other specified depressive episodes: Secondary | ICD-10-CM | POA: Diagnosis not present

## 2020-02-26 DIAGNOSIS — F438 Other reactions to severe stress: Secondary | ICD-10-CM | POA: Diagnosis not present

## 2020-02-26 DIAGNOSIS — F419 Anxiety disorder, unspecified: Secondary | ICD-10-CM | POA: Diagnosis not present

## 2020-02-27 DIAGNOSIS — Z0000 Encounter for general adult medical examination without abnormal findings: Secondary | ICD-10-CM | POA: Diagnosis not present

## 2020-02-27 DIAGNOSIS — Z79899 Other long term (current) drug therapy: Secondary | ICD-10-CM | POA: Diagnosis not present

## 2020-02-27 DIAGNOSIS — I10 Essential (primary) hypertension: Secondary | ICD-10-CM | POA: Diagnosis not present

## 2020-02-27 DIAGNOSIS — E782 Mixed hyperlipidemia: Secondary | ICD-10-CM | POA: Diagnosis not present

## 2020-02-27 DIAGNOSIS — E1122 Type 2 diabetes mellitus with diabetic chronic kidney disease: Secondary | ICD-10-CM | POA: Diagnosis not present

## 2020-03-18 DIAGNOSIS — F438 Other reactions to severe stress: Secondary | ICD-10-CM | POA: Diagnosis not present

## 2020-03-18 DIAGNOSIS — F419 Anxiety disorder, unspecified: Secondary | ICD-10-CM | POA: Diagnosis not present

## 2020-03-18 DIAGNOSIS — F3289 Other specified depressive episodes: Secondary | ICD-10-CM | POA: Diagnosis not present

## 2020-04-08 DIAGNOSIS — F419 Anxiety disorder, unspecified: Secondary | ICD-10-CM | POA: Diagnosis not present

## 2020-04-08 DIAGNOSIS — F3289 Other specified depressive episodes: Secondary | ICD-10-CM | POA: Diagnosis not present

## 2020-04-08 DIAGNOSIS — F438 Other reactions to severe stress: Secondary | ICD-10-CM | POA: Diagnosis not present

## 2020-04-22 DIAGNOSIS — F438 Other reactions to severe stress: Secondary | ICD-10-CM | POA: Diagnosis not present

## 2020-04-22 DIAGNOSIS — F419 Anxiety disorder, unspecified: Secondary | ICD-10-CM | POA: Diagnosis not present

## 2020-04-22 DIAGNOSIS — F3289 Other specified depressive episodes: Secondary | ICD-10-CM | POA: Diagnosis not present

## 2020-05-20 DIAGNOSIS — F3289 Other specified depressive episodes: Secondary | ICD-10-CM | POA: Diagnosis not present

## 2020-05-20 DIAGNOSIS — F419 Anxiety disorder, unspecified: Secondary | ICD-10-CM | POA: Diagnosis not present

## 2020-05-20 DIAGNOSIS — F438 Other reactions to severe stress: Secondary | ICD-10-CM | POA: Diagnosis not present

## 2020-06-03 DIAGNOSIS — F3289 Other specified depressive episodes: Secondary | ICD-10-CM | POA: Diagnosis not present

## 2020-06-03 DIAGNOSIS — F419 Anxiety disorder, unspecified: Secondary | ICD-10-CM | POA: Diagnosis not present

## 2020-06-03 DIAGNOSIS — F438 Other reactions to severe stress: Secondary | ICD-10-CM | POA: Diagnosis not present

## 2020-06-05 DIAGNOSIS — E1165 Type 2 diabetes mellitus with hyperglycemia: Secondary | ICD-10-CM | POA: Diagnosis not present

## 2020-06-05 DIAGNOSIS — Z7984 Long term (current) use of oral hypoglycemic drugs: Secondary | ICD-10-CM | POA: Diagnosis not present

## 2020-06-17 DIAGNOSIS — F438 Other reactions to severe stress: Secondary | ICD-10-CM | POA: Diagnosis not present

## 2020-06-17 DIAGNOSIS — F419 Anxiety disorder, unspecified: Secondary | ICD-10-CM | POA: Diagnosis not present

## 2020-06-17 DIAGNOSIS — F3289 Other specified depressive episodes: Secondary | ICD-10-CM | POA: Diagnosis not present

## 2020-07-01 DIAGNOSIS — F3289 Other specified depressive episodes: Secondary | ICD-10-CM | POA: Diagnosis not present

## 2020-07-01 DIAGNOSIS — F438 Other reactions to severe stress: Secondary | ICD-10-CM | POA: Diagnosis not present

## 2020-07-01 DIAGNOSIS — F419 Anxiety disorder, unspecified: Secondary | ICD-10-CM | POA: Diagnosis not present

## 2020-07-15 DIAGNOSIS — F438 Other reactions to severe stress: Secondary | ICD-10-CM | POA: Diagnosis not present

## 2020-07-15 DIAGNOSIS — F419 Anxiety disorder, unspecified: Secondary | ICD-10-CM | POA: Diagnosis not present

## 2020-07-15 DIAGNOSIS — F3289 Other specified depressive episodes: Secondary | ICD-10-CM | POA: Diagnosis not present

## 2020-08-13 DIAGNOSIS — F3289 Other specified depressive episodes: Secondary | ICD-10-CM | POA: Diagnosis not present

## 2020-08-13 DIAGNOSIS — F438 Other reactions to severe stress: Secondary | ICD-10-CM | POA: Diagnosis not present

## 2020-08-13 DIAGNOSIS — F419 Anxiety disorder, unspecified: Secondary | ICD-10-CM | POA: Diagnosis not present

## 2020-08-28 DIAGNOSIS — F438 Other reactions to severe stress: Secondary | ICD-10-CM | POA: Diagnosis not present

## 2020-08-28 DIAGNOSIS — F419 Anxiety disorder, unspecified: Secondary | ICD-10-CM | POA: Diagnosis not present

## 2020-08-28 DIAGNOSIS — F3289 Other specified depressive episodes: Secondary | ICD-10-CM | POA: Diagnosis not present

## 2020-09-13 DIAGNOSIS — Z7984 Long term (current) use of oral hypoglycemic drugs: Secondary | ICD-10-CM | POA: Diagnosis not present

## 2020-09-13 DIAGNOSIS — E1122 Type 2 diabetes mellitus with diabetic chronic kidney disease: Secondary | ICD-10-CM | POA: Diagnosis not present

## 2020-10-02 DIAGNOSIS — F438 Other reactions to severe stress: Secondary | ICD-10-CM | POA: Diagnosis not present

## 2020-10-02 DIAGNOSIS — F419 Anxiety disorder, unspecified: Secondary | ICD-10-CM | POA: Diagnosis not present

## 2020-10-02 DIAGNOSIS — F3289 Other specified depressive episodes: Secondary | ICD-10-CM | POA: Diagnosis not present

## 2020-10-16 DIAGNOSIS — F419 Anxiety disorder, unspecified: Secondary | ICD-10-CM | POA: Diagnosis not present

## 2020-10-16 DIAGNOSIS — F3289 Other specified depressive episodes: Secondary | ICD-10-CM | POA: Diagnosis not present

## 2020-10-16 DIAGNOSIS — F438 Other reactions to severe stress: Secondary | ICD-10-CM | POA: Diagnosis not present

## 2020-10-18 DIAGNOSIS — N481 Balanitis: Secondary | ICD-10-CM | POA: Diagnosis not present

## 2020-11-11 DIAGNOSIS — F3289 Other specified depressive episodes: Secondary | ICD-10-CM | POA: Diagnosis not present

## 2020-11-11 DIAGNOSIS — F438 Other reactions to severe stress: Secondary | ICD-10-CM | POA: Diagnosis not present

## 2020-11-11 DIAGNOSIS — F419 Anxiety disorder, unspecified: Secondary | ICD-10-CM | POA: Diagnosis not present

## 2020-11-25 DIAGNOSIS — F438 Other reactions to severe stress: Secondary | ICD-10-CM | POA: Diagnosis not present

## 2020-11-25 DIAGNOSIS — F3289 Other specified depressive episodes: Secondary | ICD-10-CM | POA: Diagnosis not present

## 2020-11-25 DIAGNOSIS — F419 Anxiety disorder, unspecified: Secondary | ICD-10-CM | POA: Diagnosis not present

## 2020-12-09 DIAGNOSIS — F3289 Other specified depressive episodes: Secondary | ICD-10-CM | POA: Diagnosis not present

## 2020-12-09 DIAGNOSIS — F438 Other reactions to severe stress: Secondary | ICD-10-CM | POA: Diagnosis not present

## 2020-12-09 DIAGNOSIS — F419 Anxiety disorder, unspecified: Secondary | ICD-10-CM | POA: Diagnosis not present

## 2020-12-31 DIAGNOSIS — F3289 Other specified depressive episodes: Secondary | ICD-10-CM | POA: Diagnosis not present

## 2020-12-31 DIAGNOSIS — F419 Anxiety disorder, unspecified: Secondary | ICD-10-CM | POA: Diagnosis not present

## 2020-12-31 DIAGNOSIS — F438 Other reactions to severe stress: Secondary | ICD-10-CM | POA: Diagnosis not present

## 2021-01-20 DIAGNOSIS — F438 Other reactions to severe stress: Secondary | ICD-10-CM | POA: Diagnosis not present

## 2021-01-20 DIAGNOSIS — F419 Anxiety disorder, unspecified: Secondary | ICD-10-CM | POA: Diagnosis not present

## 2021-01-20 DIAGNOSIS — F3289 Other specified depressive episodes: Secondary | ICD-10-CM | POA: Diagnosis not present

## 2021-02-05 DIAGNOSIS — F419 Anxiety disorder, unspecified: Secondary | ICD-10-CM | POA: Diagnosis not present

## 2021-02-05 DIAGNOSIS — F3289 Other specified depressive episodes: Secondary | ICD-10-CM | POA: Diagnosis not present

## 2021-02-05 DIAGNOSIS — F4389 Other reactions to severe stress: Secondary | ICD-10-CM | POA: Diagnosis not present

## 2021-02-17 DIAGNOSIS — F3289 Other specified depressive episodes: Secondary | ICD-10-CM | POA: Diagnosis not present

## 2021-02-17 DIAGNOSIS — F4389 Other reactions to severe stress: Secondary | ICD-10-CM | POA: Diagnosis not present

## 2021-02-17 DIAGNOSIS — F419 Anxiety disorder, unspecified: Secondary | ICD-10-CM | POA: Diagnosis not present

## 2021-03-10 DIAGNOSIS — F3289 Other specified depressive episodes: Secondary | ICD-10-CM | POA: Diagnosis not present

## 2021-03-10 DIAGNOSIS — F419 Anxiety disorder, unspecified: Secondary | ICD-10-CM | POA: Diagnosis not present

## 2021-03-10 DIAGNOSIS — F4389 Other reactions to severe stress: Secondary | ICD-10-CM | POA: Diagnosis not present

## 2021-03-14 ENCOUNTER — Ambulatory Visit: Payer: Self-pay | Attending: Internal Medicine

## 2021-03-14 ENCOUNTER — Other Ambulatory Visit (HOSPITAL_BASED_OUTPATIENT_CLINIC_OR_DEPARTMENT_OTHER): Payer: Self-pay

## 2021-03-14 DIAGNOSIS — Z7984 Long term (current) use of oral hypoglycemic drugs: Secondary | ICD-10-CM | POA: Diagnosis not present

## 2021-03-14 DIAGNOSIS — E1165 Type 2 diabetes mellitus with hyperglycemia: Secondary | ICD-10-CM | POA: Diagnosis not present

## 2021-03-14 DIAGNOSIS — H6123 Impacted cerumen, bilateral: Secondary | ICD-10-CM | POA: Diagnosis not present

## 2021-03-14 DIAGNOSIS — Z0000 Encounter for general adult medical examination without abnormal findings: Secondary | ICD-10-CM | POA: Diagnosis not present

## 2021-03-14 DIAGNOSIS — Z79899 Other long term (current) drug therapy: Secondary | ICD-10-CM | POA: Diagnosis not present

## 2021-03-14 DIAGNOSIS — Z23 Encounter for immunization: Secondary | ICD-10-CM

## 2021-03-14 MED ORDER — MODERNA COVID-19 BIVAL BOOSTER 50 MCG/0.5ML IM SUSP
INTRAMUSCULAR | 0 refills | Status: DC
  Filled 2021-03-14: qty 0.5, 1d supply, fill #0

## 2021-03-14 NOTE — Progress Notes (Signed)
   Covid-19 Vaccination Clinic  Name:  LANDON BASSFORD    MRN: [id number] DOB: [birth year]  03/14/2021  Mr. Leider was observed post Covid-19 immunization for 15 minutes without incident. He was provided with Vaccine Information Sheet and instruction to access the V-Safe system.   Mr. Edmonds was instructed to call 911 with any severe reactions post vaccine: Difficulty breathing  Swelling of face and throat  A fast heartbeat  A bad rash all over body  Dizziness and weakness   Immunizations Administered     Name Date Dose VIS Date Route   Moderna Covid-19 vaccine Bivalent Booster 03/14/2021  9:04 AM 0.5 mL 12/07/2020 Intramuscular   Manufacturer: Gala Murdoch   Lot: [id number]   NDC: 62376-283-15

## 2021-03-24 DIAGNOSIS — F3289 Other specified depressive episodes: Secondary | ICD-10-CM | POA: Diagnosis not present

## 2021-03-24 DIAGNOSIS — F4389 Other reactions to severe stress: Secondary | ICD-10-CM | POA: Diagnosis not present

## 2021-03-24 DIAGNOSIS — F419 Anxiety disorder, unspecified: Secondary | ICD-10-CM | POA: Diagnosis not present

## 2021-04-08 DIAGNOSIS — F4389 Other reactions to severe stress: Secondary | ICD-10-CM | POA: Diagnosis not present

## 2021-04-08 DIAGNOSIS — F3289 Other specified depressive episodes: Secondary | ICD-10-CM | POA: Diagnosis not present

## 2021-04-08 DIAGNOSIS — F419 Anxiety disorder, unspecified: Secondary | ICD-10-CM | POA: Diagnosis not present

## 2021-05-05 DIAGNOSIS — F4389 Other reactions to severe stress: Secondary | ICD-10-CM | POA: Diagnosis not present

## 2021-05-05 DIAGNOSIS — F3289 Other specified depressive episodes: Secondary | ICD-10-CM | POA: Diagnosis not present

## 2021-05-05 DIAGNOSIS — F419 Anxiety disorder, unspecified: Secondary | ICD-10-CM | POA: Diagnosis not present

## 2021-05-19 DIAGNOSIS — F4389 Other reactions to severe stress: Secondary | ICD-10-CM | POA: Diagnosis not present

## 2021-05-19 DIAGNOSIS — F419 Anxiety disorder, unspecified: Secondary | ICD-10-CM | POA: Diagnosis not present

## 2021-05-19 DIAGNOSIS — F3289 Other specified depressive episodes: Secondary | ICD-10-CM | POA: Diagnosis not present

## 2021-06-02 DIAGNOSIS — F419 Anxiety disorder, unspecified: Secondary | ICD-10-CM | POA: Diagnosis not present

## 2021-06-02 DIAGNOSIS — F3289 Other specified depressive episodes: Secondary | ICD-10-CM | POA: Diagnosis not present

## 2021-06-02 DIAGNOSIS — F4389 Other reactions to severe stress: Secondary | ICD-10-CM | POA: Diagnosis not present

## 2021-06-18 DIAGNOSIS — F419 Anxiety disorder, unspecified: Secondary | ICD-10-CM | POA: Diagnosis not present

## 2021-06-18 DIAGNOSIS — F3289 Other specified depressive episodes: Secondary | ICD-10-CM | POA: Diagnosis not present

## 2021-06-18 DIAGNOSIS — F4389 Other reactions to severe stress: Secondary | ICD-10-CM | POA: Diagnosis not present

## 2021-07-02 DIAGNOSIS — F4389 Other reactions to severe stress: Secondary | ICD-10-CM | POA: Diagnosis not present

## 2021-07-02 DIAGNOSIS — F419 Anxiety disorder, unspecified: Secondary | ICD-10-CM | POA: Diagnosis not present

## 2021-07-02 DIAGNOSIS — F3289 Other specified depressive episodes: Secondary | ICD-10-CM | POA: Diagnosis not present

## 2021-07-14 DIAGNOSIS — F419 Anxiety disorder, unspecified: Secondary | ICD-10-CM | POA: Diagnosis not present

## 2021-07-14 DIAGNOSIS — F3289 Other specified depressive episodes: Secondary | ICD-10-CM | POA: Diagnosis not present

## 2021-07-14 DIAGNOSIS — F4389 Other reactions to severe stress: Secondary | ICD-10-CM | POA: Diagnosis not present

## 2021-08-04 DIAGNOSIS — F3289 Other specified depressive episodes: Secondary | ICD-10-CM | POA: Diagnosis not present

## 2021-08-04 DIAGNOSIS — F4389 Other reactions to severe stress: Secondary | ICD-10-CM | POA: Diagnosis not present

## 2021-08-04 DIAGNOSIS — F419 Anxiety disorder, unspecified: Secondary | ICD-10-CM | POA: Diagnosis not present

## 2021-08-18 DIAGNOSIS — F419 Anxiety disorder, unspecified: Secondary | ICD-10-CM | POA: Diagnosis not present

## 2021-08-18 DIAGNOSIS — F3289 Other specified depressive episodes: Secondary | ICD-10-CM | POA: Diagnosis not present

## 2021-08-18 DIAGNOSIS — F4389 Other reactions to severe stress: Secondary | ICD-10-CM | POA: Diagnosis not present

## 2021-09-08 DIAGNOSIS — F4389 Other reactions to severe stress: Secondary | ICD-10-CM | POA: Diagnosis not present

## 2021-09-08 DIAGNOSIS — F3289 Other specified depressive episodes: Secondary | ICD-10-CM | POA: Diagnosis not present

## 2021-09-08 DIAGNOSIS — F419 Anxiety disorder, unspecified: Secondary | ICD-10-CM | POA: Diagnosis not present

## 2021-09-15 DIAGNOSIS — H40023 Open angle with borderline findings, high risk, bilateral: Secondary | ICD-10-CM | POA: Diagnosis not present

## 2021-09-29 DIAGNOSIS — F4389 Other reactions to severe stress: Secondary | ICD-10-CM | POA: Diagnosis not present

## 2021-09-29 DIAGNOSIS — F419 Anxiety disorder, unspecified: Secondary | ICD-10-CM | POA: Diagnosis not present

## 2021-09-29 DIAGNOSIS — F3289 Other specified depressive episodes: Secondary | ICD-10-CM | POA: Diagnosis not present

## 2021-10-13 DIAGNOSIS — F3289 Other specified depressive episodes: Secondary | ICD-10-CM | POA: Diagnosis not present

## 2021-10-13 DIAGNOSIS — F4389 Other reactions to severe stress: Secondary | ICD-10-CM | POA: Diagnosis not present

## 2021-10-13 DIAGNOSIS — F419 Anxiety disorder, unspecified: Secondary | ICD-10-CM | POA: Diagnosis not present

## 2021-10-27 DIAGNOSIS — F3289 Other specified depressive episodes: Secondary | ICD-10-CM | POA: Diagnosis not present

## 2021-10-27 DIAGNOSIS — F419 Anxiety disorder, unspecified: Secondary | ICD-10-CM | POA: Diagnosis not present

## 2021-10-27 DIAGNOSIS — F4389 Other reactions to severe stress: Secondary | ICD-10-CM | POA: Diagnosis not present

## 2021-11-12 DIAGNOSIS — F3289 Other specified depressive episodes: Secondary | ICD-10-CM | POA: Diagnosis not present

## 2021-11-12 DIAGNOSIS — F419 Anxiety disorder, unspecified: Secondary | ICD-10-CM | POA: Diagnosis not present

## 2021-11-12 DIAGNOSIS — F4389 Other reactions to severe stress: Secondary | ICD-10-CM | POA: Diagnosis not present

## 2021-11-24 DIAGNOSIS — F419 Anxiety disorder, unspecified: Secondary | ICD-10-CM | POA: Diagnosis not present

## 2021-11-24 DIAGNOSIS — F3289 Other specified depressive episodes: Secondary | ICD-10-CM | POA: Diagnosis not present

## 2021-11-24 DIAGNOSIS — F4389 Other reactions to severe stress: Secondary | ICD-10-CM | POA: Diagnosis not present

## 2021-12-15 DIAGNOSIS — F4389 Other reactions to severe stress: Secondary | ICD-10-CM | POA: Diagnosis not present

## 2021-12-15 DIAGNOSIS — F419 Anxiety disorder, unspecified: Secondary | ICD-10-CM | POA: Diagnosis not present

## 2021-12-15 DIAGNOSIS — F3289 Other specified depressive episodes: Secondary | ICD-10-CM | POA: Diagnosis not present

## 2022-01-06 DIAGNOSIS — F3289 Other specified depressive episodes: Secondary | ICD-10-CM | POA: Diagnosis not present

## 2022-01-06 DIAGNOSIS — F4389 Other reactions to severe stress: Secondary | ICD-10-CM | POA: Diagnosis not present

## 2022-01-06 DIAGNOSIS — F419 Anxiety disorder, unspecified: Secondary | ICD-10-CM | POA: Diagnosis not present

## 2022-01-26 DIAGNOSIS — F4389 Other reactions to severe stress: Secondary | ICD-10-CM | POA: Diagnosis not present

## 2022-01-26 DIAGNOSIS — F419 Anxiety disorder, unspecified: Secondary | ICD-10-CM | POA: Diagnosis not present

## 2022-01-26 DIAGNOSIS — F3289 Other specified depressive episodes: Secondary | ICD-10-CM | POA: Diagnosis not present

## 2022-02-09 DIAGNOSIS — F4389 Other reactions to severe stress: Secondary | ICD-10-CM | POA: Diagnosis not present

## 2022-02-09 DIAGNOSIS — F419 Anxiety disorder, unspecified: Secondary | ICD-10-CM | POA: Diagnosis not present

## 2022-02-09 DIAGNOSIS — F3289 Other specified depressive episodes: Secondary | ICD-10-CM | POA: Diagnosis not present

## 2022-02-23 DIAGNOSIS — F4389 Other reactions to severe stress: Secondary | ICD-10-CM | POA: Diagnosis not present

## 2022-02-23 DIAGNOSIS — F419 Anxiety disorder, unspecified: Secondary | ICD-10-CM | POA: Diagnosis not present

## 2022-02-23 DIAGNOSIS — F3289 Other specified depressive episodes: Secondary | ICD-10-CM | POA: Diagnosis not present

## 2022-03-11 DIAGNOSIS — F419 Anxiety disorder, unspecified: Secondary | ICD-10-CM | POA: Diagnosis not present

## 2022-03-11 DIAGNOSIS — F4389 Other reactions to severe stress: Secondary | ICD-10-CM | POA: Diagnosis not present

## 2022-03-11 DIAGNOSIS — F3289 Other specified depressive episodes: Secondary | ICD-10-CM | POA: Diagnosis not present

## 2022-04-01 DIAGNOSIS — F4389 Other reactions to severe stress: Secondary | ICD-10-CM | POA: Diagnosis not present

## 2022-04-01 DIAGNOSIS — F419 Anxiety disorder, unspecified: Secondary | ICD-10-CM | POA: Diagnosis not present

## 2022-04-01 DIAGNOSIS — F3289 Other specified depressive episodes: Secondary | ICD-10-CM | POA: Diagnosis not present

## 2022-04-13 DIAGNOSIS — R0981 Nasal congestion: Secondary | ICD-10-CM | POA: Diagnosis not present

## 2022-04-13 DIAGNOSIS — I10 Essential (primary) hypertension: Secondary | ICD-10-CM | POA: Diagnosis not present

## 2022-04-13 DIAGNOSIS — E1122 Type 2 diabetes mellitus with diabetic chronic kidney disease: Secondary | ICD-10-CM | POA: Diagnosis not present

## 2022-04-13 DIAGNOSIS — Z125 Encounter for screening for malignant neoplasm of prostate: Secondary | ICD-10-CM | POA: Diagnosis not present

## 2022-04-13 DIAGNOSIS — Z79899 Other long term (current) drug therapy: Secondary | ICD-10-CM | POA: Diagnosis not present

## 2022-04-13 DIAGNOSIS — Z0000 Encounter for general adult medical examination without abnormal findings: Secondary | ICD-10-CM | POA: Diagnosis not present

## 2022-04-13 DIAGNOSIS — Z03818 Encounter for observation for suspected exposure to other biological agents ruled out: Secondary | ICD-10-CM | POA: Diagnosis not present

## 2022-05-18 DIAGNOSIS — F419 Anxiety disorder, unspecified: Secondary | ICD-10-CM | POA: Diagnosis not present

## 2022-05-18 DIAGNOSIS — F4389 Other reactions to severe stress: Secondary | ICD-10-CM | POA: Diagnosis not present

## 2022-05-18 DIAGNOSIS — F3289 Other specified depressive episodes: Secondary | ICD-10-CM | POA: Diagnosis not present

## 2022-06-03 DIAGNOSIS — N472 Paraphimosis: Secondary | ICD-10-CM | POA: Diagnosis not present

## 2022-06-03 DIAGNOSIS — N481 Balanitis: Secondary | ICD-10-CM | POA: Diagnosis not present

## 2022-06-09 ENCOUNTER — Other Ambulatory Visit: Payer: Self-pay | Admitting: Urology

## 2022-07-06 DIAGNOSIS — F4389 Other reactions to severe stress: Secondary | ICD-10-CM | POA: Diagnosis not present

## 2022-07-06 DIAGNOSIS — F419 Anxiety disorder, unspecified: Secondary | ICD-10-CM | POA: Diagnosis not present

## 2022-07-06 DIAGNOSIS — F3289 Other specified depressive episodes: Secondary | ICD-10-CM | POA: Diagnosis not present

## 2022-07-08 ENCOUNTER — Encounter (HOSPITAL_BASED_OUTPATIENT_CLINIC_OR_DEPARTMENT_OTHER): Payer: Self-pay | Admitting: Urology

## 2022-07-08 NOTE — Progress Notes (Signed)
Spoke w/ via phone for pre-op interview--- pt Lab needs dos---- [facility], ekg              Lab results------ no COVID test -----patient states asymptomatic no test needed Arrive at ------- 1130 on 07-14-2022 NPO after MN NO Solid Food.  Clear liquids from MN until--- 1030 Med rec completed Medications to take morning of surgery ----- none Diabetic medication ----- n/a Patient instructed no nail polish to be worn day of surgery Patient instructed to bring photo id and insurance card day of surgery Patient aware to have Driver (ride ) / caregiver    for 24 hours after surgery -- wife, rebecca Patient Special Instructions ----- n/a Pre-Op special Istructions ----- n/a Patient verbalized understanding of instructions that were given at this phone interview. Patient denies shortness of breath, chest pain, fever, cough at this phone interview.

## 2022-07-13 NOTE — H&P (Signed)
CC/HPI: cc: chronic balanitis   06/03/22: 47 year old man referred for circumcision consultation. Patient's been having difficulty with recurrent balanitis over the last 2 years. This coincided when he started [place]. He occasionally has difficulty with paraphimosis following sexual activity. He is interested in moving forward with a circumcision.     ALLERGIES: None   MEDICATIONS: Metformin Hcl 500 mg tablet  Aspirin Regimen 81 mg tablet, delayed release  Atorvastatin Calcium 40 mg tablet  Centrum  Clotrimazole-Betamethasone 1 %-0.05 % cream  Fish Oil 1,000 mg (120 mg-180 mg) capsule  Jardiance 10 mg tablet  Losartan-Hydrochlorothiazide 100 mg-25 mg tablet     GU PSH: None   NON-GU PSH: None   GU PMH: None   NON-GU PMH: Diabetes Type 2 Hypercholesterolemia Hypertension    FAMILY HISTORY: 1 Daughter - Daughter 1 son - Son   SOCIAL HISTORY: Marital Status: Married Preferred Language: English; Ethnicity: Not Hispanic Or Latino; Race: Black or African American Current Smoking Status: Patient smokes occasionally.   Tobacco Use Assessment Completed: Used Tobacco in last 30 days? Light Drinker.  Drinks 2 caffeinated drinks per day. Patient's occupation Dance movement psychotherapist.    REVIEW OF SYSTEMS:    GU Review Male:   Patient reports frequent urination and get up at night to urinate. Patient denies hard to postpone urination, burning/ pain with urination, leakage of urine, stream starts and stops, trouble starting your stream, have to strain to urinate , erection problems, and penile pain.  Gastrointestinal (Upper):   Patient denies nausea, vomiting, and indigestion/ heartburn.  Gastrointestinal (Lower):   Patient denies diarrhea and constipation.  Constitutional:   Patient denies fever, night sweats, weight loss, and fatigue.  Skin:   Patient denies skin rash/ lesion and itching.  Eyes:   Patient denies blurred vision and double vision.  Ears/ Nose/ Throat:   Patient  denies sinus problems and sore throat.  Hematologic/Lymphatic:   Patient denies swollen glands and easy bruising.  Cardiovascular:   Patient denies leg swelling and chest pains.  Respiratory:   Patient denies cough and shortness of breath.  Endocrine:   Patient denies excessive thirst.  Musculoskeletal:   Patient denies back pain and joint pain.  Neurological:   Patient denies headaches and dizziness.  Psychologic:   Patient denies depression and anxiety.   VITAL SIGNS:      06/03/2022 08:48 AM  Weight 216 lb / 97.98 kg  Height 66 in / 167.64 cm  BP 136/90 mmHg  Pulse 85 /min  Temperature 97.5 F / 36.3 C  BMI 34.9 kg/m   GU PHYSICAL EXAMINATION:    Urethral Meatus: Normal size. No lesion, no wart, no discharge, no polyp. Normal location.  Penis: Penis uncircumcised. No foreskin warts, no cracks. No dorsal peyronie's plaques, no left corporal peyronie's plaques, no right corporal peyronie's plaques, no scarring, no shaft warts. No balanitis, no meatal stenosis.    MULTI-SYSTEM PHYSICAL EXAMINATION:    Constitutional: Well-nourished. No physical deformities. Normally developed. Good grooming.  Neck: Neck symmetrical, not swollen. Normal tracheal position.  Respiratory: No labored breathing, no use of accessory muscles.   Skin: No paleness, no jaundice, no cyanosis. No lesion, no ulcer, no rash.  Neurologic / Psychiatric: Oriented to time, oriented to place, oriented to person. No depression, no anxiety, no agitation.  Eyes: Normal conjunctivae. Normal eyelids.  Ears, Nose, Mouth, and Throat: Left ear no scars, no lesions, no masses. Right ear no scars, no lesions, no masses. Nose no scars, no lesions, no masses.  Normal hearing. Normal lips.  Musculoskeletal: Normal gait and station of head and neck.     Complexity of Data:  Records Review:   Previous Patient Records, POC Tool  Urine Test Review:   Urinalysis  Notes:                     04/13/2022: BUN 14, creatinine 0.98,  hemoglobin A1c 7.2, estimated GFR 97   PROCEDURES:          Urinalysis Dipstick Dipstick Cont'd  Color: Yellow Bilirubin: Neg mg/dL  Appearance: Clear Ketones: Neg mg/dL  Specific Gravity: >1.030 Blood: Neg ery/uL  pH: <=5.0 Protein: Trace mg/dL  Glucose: 3+ mg/dL Urobilinogen: 0.2 mg/dL    Nitrites: Neg    Leukocyte Esterase: Neg leu/uL    ASSESSMENT:      ICD-10 Details  1 GU:   Paraphimosis - 123456 Acute, Uncomplicated  2   Balanitis - N48.1 Chronic, Stable   PLAN:           Document Letter(s):  Created for Patient: Clinical Summary         Notes:   1. Balanitis: I discussed with patient management of recurrent balanitis including observation, topical antifungal/steroid, and circumcision. Patient is most interested in moving forward circumcision. Risks and benefits of circumcision were discussed with the patient in detail including but not limited to pain, bleeding, infection, poor cosmetic result, increased sensitivity of glans, damage surrounding structures. Patient understands these risks and wishes to be scheduled for surgery.

## 2022-07-14 ENCOUNTER — Ambulatory Visit (HOSPITAL_BASED_OUTPATIENT_CLINIC_OR_DEPARTMENT_OTHER)
Admission: RE | Admit: 2022-07-14 | Discharge: 2022-07-14 | Disposition: A | Discharge status: Home / Self Care | Payer: BC Managed Care – PPO | Attending: Urology | Admitting: Urology

## 2022-07-14 ENCOUNTER — Encounter (HOSPITAL_BASED_OUTPATIENT_CLINIC_OR_DEPARTMENT_OTHER): Payer: Self-pay | Admitting: Urology

## 2022-07-14 ENCOUNTER — Ambulatory Visit (HOSPITAL_BASED_OUTPATIENT_CLINIC_OR_DEPARTMENT_OTHER): Payer: BC Managed Care – PPO | Admitting: Anesthesiology

## 2022-07-14 ENCOUNTER — Encounter (HOSPITAL_BASED_OUTPATIENT_CLINIC_OR_DEPARTMENT_OTHER)
Admission: RE | Disposition: A | Discharge status: Home / Self Care | Payer: Self-pay | Source: Home / Self Care | Attending: Urology

## 2022-07-14 ENCOUNTER — Other Ambulatory Visit: Payer: Self-pay

## 2022-07-14 DIAGNOSIS — N472 Paraphimosis: Secondary | ICD-10-CM | POA: Insufficient documentation

## 2022-07-14 DIAGNOSIS — N481 Balanitis: Secondary | ICD-10-CM | POA: Insufficient documentation

## 2022-07-14 DIAGNOSIS — N471 Phimosis: Secondary | ICD-10-CM | POA: Insufficient documentation

## 2022-07-14 DIAGNOSIS — F17200 Nicotine dependence, unspecified, uncomplicated: Secondary | ICD-10-CM | POA: Diagnosis not present

## 2022-07-14 DIAGNOSIS — Z01818 Encounter for other preprocedural examination: Secondary | ICD-10-CM

## 2022-07-14 DIAGNOSIS — I493 Ventricular premature depolarization: Secondary | ICD-10-CM | POA: Insufficient documentation

## 2022-07-14 DIAGNOSIS — N477 Other inflammatory diseases of prepuce: Secondary | ICD-10-CM | POA: Diagnosis not present

## 2022-07-14 HISTORY — DX: Type 2 diabetes mellitus with diabetic chronic kidney disease: E11.22

## 2022-07-14 HISTORY — DX: Type 2 diabetes mellitus without complications: E11.9

## 2022-07-14 HISTORY — DX: Mixed hyperlipidemia: E78.2

## 2022-07-14 HISTORY — DX: Presence of spectacles and contact lenses: Z97.3

## 2022-07-14 HISTORY — DX: Balanitis: N48.1

## 2022-07-14 HISTORY — PX: CIRCUMCISION: SHX1350

## 2022-07-14 HISTORY — DX: Essential (primary) hypertension: I10

## 2022-07-14 LAB — POCT I-STAT, CHEM 8
BUN: 18 mg/dL (ref 6–20)
Calcium, Ion: 1.22 mmol/L (ref 1.15–1.40)
Chloride: 100 mmol/L (ref 98–111)
Creatinine, Ser: 1 mg/dL (ref 0.61–1.24)
Glucose, Bld: 123 mg/dL — ABNORMAL HIGH (ref 70–99)
HCT: 50 % (ref 39.0–52.0)
Hemoglobin: 17 g/dL (ref 13.0–17.0)
Potassium: 4.5 mmol/L (ref 3.5–5.1)
Sodium: 139 mmol/L (ref 135–145)
TCO2: 28 mmol/L (ref 22–32)

## 2022-07-14 LAB — GLUCOSE, CAPILLARY: Glucose-Capillary: 120 mg/dL — ABNORMAL HIGH (ref 70–99)

## 2022-07-14 SURGERY — CIRCUMCISION, ADULT
Anesthesia: General

## 2022-07-14 MED ORDER — FENTANYL CITRATE (PF) 100 MCG/2ML IJ SOLN
INTRAMUSCULAR | Status: DC | PRN
  Administered 2022-07-14 (×2): 50 ug via INTRAVENOUS

## 2022-07-14 MED ORDER — LIDOCAINE 2% (20 MG/ML) 5 ML SYRINGE
INTRAMUSCULAR | Status: DC | PRN
  Administered 2022-07-14: 60 mg via INTRAVENOUS

## 2022-07-14 MED ORDER — MIDAZOLAM HCL 2 MG/2ML IJ SOLN
INTRAMUSCULAR | Status: DC | PRN
  Administered 2022-07-14: 2 mg via INTRAVENOUS

## 2022-07-14 MED ORDER — PROPOFOL 10 MG/ML IV BOLUS
INTRAVENOUS | Status: AC
  Filled 2022-07-14: qty 20

## 2022-07-14 MED ORDER — MIDAZOLAM HCL 2 MG/2ML IJ SOLN
INTRAMUSCULAR | Status: AC
  Filled 2022-07-14: qty 2

## 2022-07-14 MED ORDER — OXYCODONE HCL 5 MG/5ML PO SOLN: 5.0000 mg | Freq: Once | ORAL | Status: DC | PRN

## 2022-07-14 MED ORDER — LIDOCAINE HCL 1 % IJ SOLN
INTRAMUSCULAR | Status: DC | PRN
  Administered 2022-07-14: 20 mL

## 2022-07-14 MED ORDER — ONDANSETRON HCL 4 MG/2ML IJ SOLN
INTRAMUSCULAR | Status: AC
  Filled 2022-07-14: qty 2

## 2022-07-14 MED ORDER — BUPIVACAINE HCL (PF) 0.25 % IJ SOLN
INTRAMUSCULAR | Status: DC | PRN
  Administered 2022-07-14: 20 mL

## 2022-07-14 MED ORDER — FENTANYL CITRATE (PF) 100 MCG/2ML IJ SOLN
INTRAMUSCULAR | Status: AC
  Filled 2022-07-14: qty 2

## 2022-07-14 MED ORDER — DEXAMETHASONE SODIUM PHOSPHATE 10 MG/ML IJ SOLN
INTRAMUSCULAR | Status: AC
  Filled 2022-07-14: qty 1

## 2022-07-14 MED ORDER — FENTANYL CITRATE (PF) 100 MCG/2ML IJ SOLN: 25.0000 ug | INTRAMUSCULAR | Status: DC | PRN

## 2022-07-14 MED ORDER — LACTATED RINGERS IV SOLN: INTRAVENOUS | Status: DC

## 2022-07-14 MED ORDER — PROPOFOL 10 MG/ML IV BOLUS
INTRAVENOUS | Status: DC | PRN
  Administered 2022-07-14: 300 mg via INTRAVENOUS

## 2022-07-14 MED ORDER — LIDOCAINE HCL (PF) 2 % IJ SOLN
INTRAMUSCULAR | Status: AC
  Filled 2022-07-14: qty 5

## 2022-07-14 MED ORDER — ONDANSETRON HCL 4 MG/2ML IJ SOLN: 4.0000 mg | Freq: Four times a day (QID) | INTRAMUSCULAR | Status: DC | PRN

## 2022-07-14 MED ORDER — OXYCODONE HCL 5 MG PO TABS: 5.0000 mg | ORAL_TABLET | Freq: Once | ORAL | Status: DC | PRN

## 2022-07-14 MED ORDER — CEFAZOLIN SODIUM-DEXTROSE 2-4 GM/100ML-% IV SOLN
2.0000 g | INTRAVENOUS | Status: AC
  Administered 2022-07-14: 2 g via INTRAVENOUS

## 2022-07-14 MED ORDER — ONDANSETRON HCL 4 MG/2ML IJ SOLN
INTRAMUSCULAR | Status: DC | PRN
  Administered 2022-07-14: 4 mg via INTRAVENOUS

## 2022-07-14 MED ORDER — DEXAMETHASONE SODIUM PHOSPHATE 10 MG/ML IJ SOLN
INTRAMUSCULAR | Status: DC | PRN
  Administered 2022-07-14: 4 mg via INTRAVENOUS

## 2022-07-14 MED ORDER — CEFAZOLIN SODIUM-DEXTROSE 2-4 GM/100ML-% IV SOLN
INTRAVENOUS | Status: AC
  Filled 2022-07-14: qty 100

## 2022-07-14 MED ORDER — BACITRACIN 500 UNIT/GM EX OINT
TOPICAL_OINTMENT | CUTANEOUS | Status: DC | PRN
  Administered 2022-07-14: 1 via TOPICAL

## 2022-07-14 SURGICAL SUPPLY — 27 items
BLADE SURG 15 STRL LF DISP TIS (BLADE) ×1 IMPLANT
BLADE SURG 15 STRL SS (BLADE) ×1
BNDG CMPR 75X21 PLY HI ABS (MISCELLANEOUS) ×1
BNDG COHESIVE 1X5 TAN STRL LF (GAUZE/BANDAGES/DRESSINGS) ×1 IMPLANT
COVER BACK TABLE 60X90IN (DRAPES) ×1 IMPLANT
COVER MAYO STAND STRL (DRAPES) ×1 IMPLANT
DRAPE LAPAROTOMY T 98X78 PEDS (DRAPES) ×1 IMPLANT
ELECT REM PT RETURN 9FT ADLT (ELECTROSURGICAL) ×1
ELECTRODE REM PT RTRN 9FT ADLT (ELECTROSURGICAL) ×1 IMPLANT
GAUZE 4X4 16PLY ~~LOC~~+RFID DBL (SPONGE) ×1 IMPLANT
GAUZE SPONGE 4X4 12PLY STRL (GAUZE/BANDAGES/DRESSINGS) ×1 IMPLANT
GAUZE STRETCH 2X75IN STRL (MISCELLANEOUS) IMPLANT
GAUZE XEROFORM 1X8 LF (GAUZE/BANDAGES/DRESSINGS) ×1 IMPLANT
GLOVE BIO SURGEON STRL SZ 6.5 (GLOVE) ×1 IMPLANT
GOWN STRL REUS W/TWL LRG LVL3 (GOWN DISPOSABLE) ×1 IMPLANT
KIT TURNOVER CYSTO (KITS) ×1 IMPLANT
NDL HYPO 22X1.5 SAFETY MO (MISCELLANEOUS) ×1 IMPLANT
NEEDLE HYPO 22X1.5 SAFETY MO (MISCELLANEOUS) ×1 IMPLANT
NS IRRIG 1000ML POUR BTL (IV SOLUTION) IMPLANT
PACK BASIN DAY SURGERY FS (CUSTOM PROCEDURE TRAY) ×1 IMPLANT
PENCIL SMOKE EVACUATOR (MISCELLANEOUS) IMPLANT
SLEEVE SCD COMPRESS KNEE MED (STOCKING) ×1 IMPLANT
SUT CHROMIC 3 0 SH 27 (SUTURE) ×2 IMPLANT
SUT CHROMIC 4 0 SH 27 (SUTURE) ×1 IMPLANT
SYR BULB EAR ULCER 3OZ GRN STR (SYRINGE) ×1 IMPLANT
SYR CONTROL 10ML LL (SYRINGE) IMPLANT
TOWEL OR 17X24 6PK STRL BLUE (TOWEL DISPOSABLE) ×1 IMPLANT

## 2022-07-14 NOTE — Transfer of Care (Signed)
Immediate Anesthesia Transfer of Care Note  Patient: Todd Williams  Procedure(s) Performed: Procedure(s) (LRB): CIRCUMCISION ADULT (N/A)  Patient Location: PACU  Anesthesia Type: General  Level of Consciousness: awake, oriented, sedated and patient cooperative  Airway & Oxygen Therapy: Patient Spontanous Breathing and Patient connected to face mask oxygen  Post-op Assessment: Report given to PACU RN and Post -op Vital signs reviewed and stable  Post vital signs: Reviewed and stable  Complications: No apparent anesthesia complications  Last Vitals:  Vitals Value Taken Time  BP 138/100 07/14/22 1248  Temp    Pulse 85 07/14/22 1249  Resp 14 07/14/22 1249  SpO2 97 % 07/14/22 1249  Vitals shown include unvalidated device data.  Last Pain:  Vitals:   07/14/22 1134  TempSrc: Oral  PainSc: 0-No pain      Patients Stated Pain Goal: 5 ([id number] [phone])  Complications: No notable events documented.

## 2022-07-14 NOTE — Interval H&P Note (Signed)
History and Physical Interval Note:  07/14/2022 11:21 AM  Todd Williams  has presented today for surgery, with the diagnosis of BALANITIS.  The various methods of treatment have been discussed with the patient and family. After consideration of risks, benefits and other options for treatment, the patient has consented to  Procedure(s) with comments: CIRCUMCISION ADULT (N/A) - 1 HR as a surgical intervention.  The patient's history has been reviewed, patient examined, no change in status, stable for surgery.  I have reviewed the patient's chart and labs.  Questions were answered to the patient's satisfaction.     Delila Kuklinski D Patsye Sullivant

## 2022-07-14 NOTE — Op Note (Signed)
Operative Note  Preoperative diagnosis:  1.  Phimosis  Postoperative diagnosis: 1.  Phimosis  Procedure(s): 1.  Circumcision  Surgeon: Jacalyn Lefevre, MD  Assistants: None  Anesthesia: general  Complications: None immediate  EBL: 10 cc  Specimens: 1.  foreskin  Drains/Catheters: 1.  None  Intraoperative findings: Phimosis  Indication: 47 yo man with recurrent balanitis desires circumcision.  Description of procedure:  The patient was identified and consent was obtained.  The patient was taken to the operating room and placed in the supine position.  The patient was placed under general anesthesia.  Perioperative antibiotics were administered.  The patient was placed in supine position.  Patient was prepped and draped in a standard sterile fashion and a timeout was performed.  Perioperative antibiotics were administered.  Foreskin adhesions were released with a hemostat.  The foreskin was then retracted and Betadine was applied to the inner portion of the foreskin the head of the penis.  A marking pen was used to mark out a circumferential line approximate 1 cm proximal to the corona.  A scalpel was used to sharply incise along this line.  The foreskin was then returned to its normal position and another circumferential line was made along the level of the corona with a marking pen.  A scalpel was again used to sharply incise along this line.  A straight clamp was used to clamp the midline of the foreskin.  This was then divided.  The excess foreskin was then removed with Bovie electrocautery taking great care not to come close to the glans.  The foreskin was collected for specimen.  Hemostasis was obtained with Bovie electrocautery.  The frenulum was approximated with 4-0 chromic running suture. The skin was then reapproximated with interrupted 3-0 chromic sutures and a dressing was applied.  This concluded the operation.  The patient tolerated the procedure well was stable  postoperatively.  Plan: Patient will return in several weeks for a postoperative check.

## 2022-07-14 NOTE — Anesthesia Preprocedure Evaluation (Signed)
Anesthesia Evaluation  Patient identified by MRN, date of birth, ID band Patient awake    Reviewed: Allergy & Precautions, H&P , NPO status , Patient's Chart, lab work & pertinent test results  Airway Mallampati: II   Neck ROM: full    Dental   Pulmonary neg pulmonary ROS   breath sounds clear to auscultation       Cardiovascular hypertension,  Rhythm:regular Rate:Normal     Neuro/Psych    GI/Hepatic   Endo/Other  diabetes, Type 2    Renal/GU Renal disease     Musculoskeletal   Abdominal   Peds  Hematology   Anesthesia Other Findings   Reproductive/Obstetrics                             Anesthesia Physical Anesthesia Plan  ASA: 2  Anesthesia Plan: General   Post-op Pain Management:    Induction: Intravenous  PONV Risk Score and Plan: 2 and Ondansetron, Dexamethasone, Midazolam and Treatment may vary due to age or medical condition  Airway Management Planned: LMA  Additional Equipment:   Intra-op Plan:   Post-operative Plan: Extubation in OR  Informed Consent: I have reviewed the patients History and Physical, chart, labs and discussed the procedure including the risks, benefits and alternatives for the proposed anesthesia with the patient or authorized representative who has indicated his/her understanding and acceptance.     Dental advisory given  Plan Discussed with: Anesthesiologist, CRNA and Surgeon  Anesthesia Plan Comments:        Anesthesia Quick Evaluation

## 2022-07-14 NOTE — Discharge Instructions (Addendum)
Postoperative instructions for circumcision  Wound:  You can remove the dressing on Thursday, 3/21. If it falls off before this is okay. In most cases your incision will have absorbable sutures that run along the course of your incision and will dissolve within the first 10-20 days. Some will fall out even earlier. Expect some redness as the sutures dissolved but this should occur only around the sutures. If there is generalized redness, especially with increasing pain or swelling, let us know. The penis will very likely get "black and blue" as the blood in the tissues spread. Sometimes the whole penis will turn colors. The black and blue is followed by a yellow and brown color. In time, all the discoloration will go away.  Diet:  You may return to your normal diet within 24 hours following your surgery. You may note some mild nausea and possibly vomiting the first 6-8 hours following surgery. This is usually due to the side effects of anesthesia, and will disappear quite soon. I would suggest clear liquids and a very light meal the first evening following your surgery.  Activity:  Your physical activity should be restricted the first 48 hours. During that time you should remain relatively inactive, moving about only when necessary. During the first 7-10 days following surgery he should avoid lifting any heavy objects (anything greater than 15 pounds), and avoid strenuous exercise. If you work, ask [place] specifically about your restrictions, both for work and home. We will write a note to your employer if needed.  Ice packs can be placed on and off over the penis for the first 48 hours to help relieve the pain and keep the swelling down. Frozen peas or corn in a ZipLock bag can be frozen, used and re-frozen. Fifteen minutes on and 15 minutes off is a reasonable schedule.   Hygiene:  You may shower 48 hours after your surgery. Tub bathing should be restricted until 3-4 weeks.  Medication:  You  will be sent home with some type of pain medication. In many cases you will be sent home with a narcotic pain pill (hydrocodone-acetaminophen). If the pain is not too bad, you may take either Tylenol (acetaminophen) or Advil (ibuprofen) which contain no narcotic agents, and might be tolerated a little better, with fewer side effects. If the pain medication you are sent home with does not control the pain, you will have to let us know. Some narcotic pain medications cannot be given or refilled by a phone call to a pharmacy.  Problems you should report to [place]:  Fever of 101.0 degrees Fahrenheit or greater. Moderate or severe swelling under the skin incision or involving the scrotum. Drug reaction such as hives, a rash, nausea or vomiting.        Post Anesthesia Home Care Instructions  Activity: Get plenty of rest for the remainder of the day. A responsible individual must stay with you for 24 hours following the procedure.  For the next 24 hours, DO NOT: -Drive a car -Paediatric nurse -Drink alcoholic beverages -Take any medication unless instructed by your physician -Make any legal decisions or sign important papers.  Meals: Start with liquid foods such as gelatin or soup. Progress to regular foods as tolerated. Avoid greasy, spicy, heavy foods. If nausea and/or vomiting occur, drink only clear liquids until the nausea and/or vomiting subsides. Call your physician if vomiting continues.  Special Instructions/Symptoms: Your throat may feel dry or sore from the anesthesia or the breathing tube placed in your  throat during surgery. If this causes discomfort, gargle with warm salt water. The discomfort should disappear within 24 hours.

## 2022-07-14 NOTE — Anesthesia Procedure Notes (Signed)
Procedure Name: LMA Insertion Date/Time: 07/14/2022 11:58 AM  Performed by: Suan Halter, CRNAPre-anesthesia Checklist: Patient identified, Emergency Drugs available, Suction available and Patient being monitored Patient Re-evaluated:Patient Re-evaluated prior to induction Oxygen Delivery Method: Circle system utilized Preoxygenation: Pre-oxygenation with 100% oxygen Induction Type: IV induction Ventilation: Mask ventilation without difficulty LMA: LMA inserted LMA Size: 4.0 Number of attempts: 1 Airway Equipment and Method: Bite block Placement Confirmation: positive ETCO2 Tube secured with: Tape Dental Injury: Teeth and Oropharynx as per pre-operative assessment

## 2022-07-15 ENCOUNTER — Encounter (HOSPITAL_BASED_OUTPATIENT_CLINIC_OR_DEPARTMENT_OTHER): Payer: Self-pay | Admitting: Urology

## 2022-07-15 LAB — SURGICAL PATHOLOGY

## 2022-07-16 NOTE — Anesthesia Postprocedure Evaluation (Signed)
Anesthesia Post Note  Patient: Todd Williams  Procedure(s) Performed: CIRCUMCISION ADULT     Patient location during evaluation: PACU Anesthesia Type: General Level of consciousness: awake and alert Pain management: pain level controlled Vital Signs Assessment: post-procedure vital signs reviewed and stable Respiratory status: spontaneous breathing, nonlabored ventilation, respiratory function stable and patient connected to nasal cannula oxygen Cardiovascular status: blood pressure returned to baseline and stable Postop Assessment: no apparent nausea or vomiting Anesthetic complications: no   No notable events documented.  Last Vitals:  Vitals:   07/14/22 1315 07/14/22 1330  BP: 134/83 122/75  Pulse: 63 80  Resp: 19 19  Temp:  36.7 C  SpO2: 94% 95%    Last Pain:  Vitals:   07/15/22 1032  TempSrc:   PainSc: 0-No pain                 Martisha Toulouse S

## 2022-07-29 DIAGNOSIS — F3289 Other specified depressive episodes: Secondary | ICD-10-CM | POA: Diagnosis not present

## 2022-07-29 DIAGNOSIS — F419 Anxiety disorder, unspecified: Secondary | ICD-10-CM | POA: Diagnosis not present

## 2022-07-29 DIAGNOSIS — F4389 Other reactions to severe stress: Secondary | ICD-10-CM | POA: Diagnosis not present

## 2022-08-05 DIAGNOSIS — N481 Balanitis: Secondary | ICD-10-CM | POA: Diagnosis not present

## 2022-08-05 DIAGNOSIS — N472 Paraphimosis: Secondary | ICD-10-CM | POA: Diagnosis not present

## 2022-09-09 DIAGNOSIS — F419 Anxiety disorder, unspecified: Secondary | ICD-10-CM | POA: Diagnosis not present

## 2022-09-09 DIAGNOSIS — F4389 Other reactions to severe stress: Secondary | ICD-10-CM | POA: Diagnosis not present

## 2022-09-09 DIAGNOSIS — F3289 Other specified depressive episodes: Secondary | ICD-10-CM | POA: Diagnosis not present

## 2022-09-16 DIAGNOSIS — H40023 Open angle with borderline findings, high risk, bilateral: Secondary | ICD-10-CM | POA: Diagnosis not present

## 2022-09-30 DIAGNOSIS — F419 Anxiety disorder, unspecified: Secondary | ICD-10-CM | POA: Diagnosis not present

## 2022-09-30 DIAGNOSIS — F4389 Other reactions to severe stress: Secondary | ICD-10-CM | POA: Diagnosis not present

## 2022-09-30 DIAGNOSIS — F3289 Other specified depressive episodes: Secondary | ICD-10-CM | POA: Diagnosis not present

## 2022-10-19 DIAGNOSIS — F419 Anxiety disorder, unspecified: Secondary | ICD-10-CM | POA: Diagnosis not present

## 2022-10-19 DIAGNOSIS — F3289 Other specified depressive episodes: Secondary | ICD-10-CM | POA: Diagnosis not present

## 2022-10-19 DIAGNOSIS — F4389 Other reactions to severe stress: Secondary | ICD-10-CM | POA: Diagnosis not present

## 2022-11-17 DIAGNOSIS — F4389 Other reactions to severe stress: Secondary | ICD-10-CM | POA: Diagnosis not present

## 2022-11-17 DIAGNOSIS — F3289 Other specified depressive episodes: Secondary | ICD-10-CM | POA: Diagnosis not present

## 2022-11-17 DIAGNOSIS — F419 Anxiety disorder, unspecified: Secondary | ICD-10-CM | POA: Diagnosis not present

## 2022-12-03 DIAGNOSIS — F3289 Other specified depressive episodes: Secondary | ICD-10-CM | POA: Diagnosis not present

## 2022-12-03 DIAGNOSIS — F419 Anxiety disorder, unspecified: Secondary | ICD-10-CM | POA: Diagnosis not present

## 2022-12-03 DIAGNOSIS — F4389 Other reactions to severe stress: Secondary | ICD-10-CM | POA: Diagnosis not present

## 2022-12-23 DIAGNOSIS — F419 Anxiety disorder, unspecified: Secondary | ICD-10-CM | POA: Diagnosis not present

## 2022-12-23 DIAGNOSIS — F4389 Other reactions to severe stress: Secondary | ICD-10-CM | POA: Diagnosis not present

## 2022-12-23 DIAGNOSIS — F3289 Other specified depressive episodes: Secondary | ICD-10-CM | POA: Diagnosis not present

## 2023-01-07 DIAGNOSIS — F419 Anxiety disorder, unspecified: Secondary | ICD-10-CM | POA: Diagnosis not present

## 2023-01-07 DIAGNOSIS — F3289 Other specified depressive episodes: Secondary | ICD-10-CM | POA: Diagnosis not present

## 2023-01-07 DIAGNOSIS — F4389 Other reactions to severe stress: Secondary | ICD-10-CM | POA: Diagnosis not present

## 2023-01-25 DIAGNOSIS — F3289 Other specified depressive episodes: Secondary | ICD-10-CM | POA: Diagnosis not present

## 2023-01-25 DIAGNOSIS — F419 Anxiety disorder, unspecified: Secondary | ICD-10-CM | POA: Diagnosis not present

## 2023-01-25 DIAGNOSIS — F4389 Other reactions to severe stress: Secondary | ICD-10-CM | POA: Diagnosis not present

## 2023-02-08 DIAGNOSIS — F4389 Other reactions to severe stress: Secondary | ICD-10-CM | POA: Diagnosis not present

## 2023-02-08 DIAGNOSIS — F3289 Other specified depressive episodes: Secondary | ICD-10-CM | POA: Diagnosis not present

## 2023-02-08 DIAGNOSIS — F419 Anxiety disorder, unspecified: Secondary | ICD-10-CM | POA: Diagnosis not present

## 2023-04-15 DIAGNOSIS — E1122 Type 2 diabetes mellitus with diabetic chronic kidney disease: Secondary | ICD-10-CM | POA: Diagnosis not present

## 2023-04-15 DIAGNOSIS — I10 Essential (primary) hypertension: Secondary | ICD-10-CM | POA: Diagnosis not present

## 2023-04-15 DIAGNOSIS — Z0000 Encounter for general adult medical examination without abnormal findings: Secondary | ICD-10-CM | POA: Diagnosis not present

## 2023-04-15 DIAGNOSIS — Z79899 Other long term (current) drug therapy: Secondary | ICD-10-CM | POA: Diagnosis not present

## 2023-04-15 DIAGNOSIS — Z125 Encounter for screening for malignant neoplasm of prostate: Secondary | ICD-10-CM | POA: Diagnosis not present

## 2023-06-18 ENCOUNTER — Ambulatory Visit (AMBULATORY_SURGERY_CENTER): Payer: Self-pay

## 2023-06-18 VITALS — Ht 66.0 in | Wt 218.0 lb

## 2023-06-18 DIAGNOSIS — Z1211 Encounter for screening for malignant neoplasm of colon: Secondary | ICD-10-CM

## 2023-06-18 MED ORDER — SUFLAVE 178.7 G PO SOLR: 1.0000 | ORAL | 0 refills | Status: DC

## 2023-06-18 NOTE — Progress Notes (Signed)
 No egg or soy allergy known to patient  No issues known to pt with past sedation with any surgeries or procedures Patient denies ever being told they had issues or difficulty with intubation  No FH of Malignant Hyperthermia Pt is not on diet pills Pt is not on  home 02  Pt is not on blood thinners  Pt denies issues with constipation  No A fib or A flutter Have any cardiac testing pending-- no  LOA: independent  Prep: Suflave   PV completed with patient. Prep instructions sent via mychart and home address.

## 2023-06-28 NOTE — Progress Notes (Unsigned)
 [facility] History and Physical   Primary Care Physician:  Mila Palmer, MD   Reason for Procedure:  Colon cancer screening  Plan:    Screening colonoscopy   HPI: Todd Williams is a 48 y.o. male undergoing screening colonoscopy for colon cancer screening.  This is the patient's first colonoscopy.  No documented family history of colorectal cancer.  Patient denies current symptoms of change in bowel habits or rectal bleeding.   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 Authorizing Provider  aspirin EC 81 MG tablet Take 81 mg by mouth at bedtime.    [provider]  atorvastatin (LIPITOR) 40 MG tablet Take 40 mg by mouth at bedtime.    [provider]  empagliflozin (JARDIANCE) 10 MG TABS tablet Take 10 mg by mouth daily.    [provider]  losartan-hydrochlorothiazide (HYZAAR) 100-12.5 MG tablet Take 1 tablet by mouth daily.    [provider]  metFORMIN (GLUCOPHAGE) 500 MG tablet Take 1,000 mg by mouth daily with breakfast.    [provider]  Multiple Vitamins-Minerals (CENTRUM MEN) TABS Take 1 tablet by mouth daily.    [provider]  Omega-3 Fatty Acids (FISH OIL) 1000 MG CAPS Take 1,000 mg by mouth 3 (three) times daily with meals.    [provider]  PEG 3350-KCl-NaCl-NaSulf-MgSul (SUFLAVE)  178.7 g SOLR Take 1 kit by mouth as directed. 06/18/23   Ottie Glazier, MD    Current Outpatient Medications  Medication Sig Dispense Refill   aspirin EC 81 MG tablet Take 81 mg by mouth at bedtime.     atorvastatin (LIPITOR) 40 MG tablet Take 40 mg by mouth at bedtime.     empagliflozin (JARDIANCE) 10 MG TABS tablet Take 10 mg by mouth daily.     losartan-hydrochlorothiazide (HYZAAR) 100-12.5 MG tablet Take 1 tablet by mouth daily.     metFORMIN (GLUCOPHAGE) 500 MG tablet Take 1,000 mg by mouth daily with breakfast.     Multiple Vitamins-Minerals (CENTRUM MEN) TABS Take 1 tablet by mouth daily.     Omega-3 Fatty Acids (FISH OIL) 1000 MG CAPS Take 1,000 mg by mouth 3 (three) times daily with meals.     Current Facility-Administered Medications  Medication Dose Route Frequency Provider Last Rate Last Admin   0.9 %  sodium chloride infusion  500 mL Intravenous Once Llana Deshazo, Durene Romans, MD        Allergies as of 06/29/2023   (No Known Allergies)    Family History  Problem Relation Age of Onset   Colon cancer Neg Hx    Rectal cancer Neg Hx    Stomach cancer Neg Hx     Social History   Socioeconomic History   Marital status: Married  Spouse name: Not on file   Number of children: Not on file   Years of education: Not on file   Highest education level: Not on file  Occupational History   Not on file  Tobacco Use   Smoking status: Never   Smokeless tobacco: Never  Vaping Use   Vaping status: Never Used  Substance and Sexual Activity   Alcohol use: Yes    Comment: seldom   Drug use: Never   Sexual activity: Not on file  Other Topics Concern   Not on file  Social History Narrative   Not on file   Social Drivers of Health   Financial Resource Strain: Not on file  Food Insecurity: Not on file  Transportation Needs: Not on file  Physical Activity: Not on file  Stress: Not on file  Social Connections: Not on file  Intimate Partner Violence: Not on file    Review  of Systems:  All other review of systems negative except as mentioned in the HPI.  Physical Exam: Vital signs BP (!) 122/57   Pulse 87   Temp 98.4 F (36.9 C)   Resp 20   Ht 5\' 6"  (1.676 m)   Wt 218 lb (98.9 kg)   SpO2 98%   BMI 35.19 kg/m   General:   Alert,  Well-developed, well-nourished, pleasant and cooperative in NAD Airway:  Mallampati 2 Lungs:  Clear throughout to auscultation.   Heart:  Regular rate and rhythm; no murmurs, clicks, rubs,  or gallops. Abdomen:  Soft, nontender and nondistended. Normal bowel sounds.   Neuro/Psych:  Normal mood and affect. A and O x 3  Maren Beach, MD [facility]

## 2023-06-29 ENCOUNTER — Ambulatory Visit: Payer: BC Managed Care – PPO | Admitting: Pediatrics

## 2023-06-29 ENCOUNTER — Encounter: Payer: Self-pay | Admitting: Pediatrics

## 2023-06-29 VITALS — BP 128/84 | HR 88 | Temp 98.4°F | Resp 18 | Ht 66.0 in | Wt 218.0 lb

## 2023-06-29 DIAGNOSIS — K5730 Diverticulosis of large intestine without perforation or abscess without bleeding: Secondary | ICD-10-CM

## 2023-06-29 DIAGNOSIS — Z1211 Encounter for screening for malignant neoplasm of colon: Secondary | ICD-10-CM | POA: Diagnosis present

## 2023-06-29 DIAGNOSIS — K648 Other hemorrhoids: Secondary | ICD-10-CM | POA: Diagnosis not present

## 2023-06-29 DIAGNOSIS — D128 Benign neoplasm of rectum: Secondary | ICD-10-CM | POA: Diagnosis not present

## 2023-06-29 MED ORDER — SODIUM CHLORIDE 0.9 % IV SOLN: 500.0000 mL | Freq: Once | INTRAVENOUS | Status: DC

## 2023-06-29 NOTE — Progress Notes (Signed)
 Vss nad trans to pacu

## 2023-06-29 NOTE — Progress Notes (Signed)
 Pt's states no medical or surgical changes since previsit or office visit.

## 2023-06-29 NOTE — Patient Instructions (Signed)
 -  Resume previous diet. - Continue present medications. - Await pathology results.  YOU HAD AN ENDOSCOPIC PROCEDURE TODAY AT THE [facility]:   Refer to the procedure report that was given to you for any specific questions about what was found during the examination.  If the procedure report does not answer your questions, please call your gastroenterologist to clarify.  If you requested that your care partner not be given the details of your procedure findings, then the procedure report has been included in a sealed envelope for you to review at your convenience later.  YOU SHOULD EXPECT: Some feelings of bloating in the abdomen. Passage of more gas than usual.  Walking can help get rid of the air that was put into your GI tract during the procedure and reduce the bloating. If you had a lower endoscopy (such as a colonoscopy or flexible sigmoidoscopy) you may notice spotting of blood in your stool or on the toilet paper. If you underwent a bowel prep for your procedure, you may not have a normal bowel movement for a few days.  Please Note:  You might notice some irritation and congestion in your nose or some drainage.  This is from the oxygen used during your procedure.  There is no need for concern and it should clear up in a day or so.  SYMPTOMS TO REPORT IMMEDIATELY:  Following lower endoscopy (colonoscopy or flexible sigmoidoscopy):  Excessive amounts of blood in the stool  Significant tenderness or worsening of abdominal pains  Swelling of the abdomen that is new, acute  Fever of 100F or higher  Following upper endoscopy (EGD)  Vomiting of blood or coffee ground material  New chest pain or pain under the shoulder blades  Painful or persistently difficult swallowing  New shortness of breath  Fever of 100F or higher  Black, tarry-looking stools  For urgent or emergent issues, a gastroenterologist can be reached at any hour by calling [phone]. Do not use MyChart  messaging for urgent concerns.    DIET:  We do recommend a small meal at first, but then you may proceed to your regular diet.  Drink plenty of fluids but you should avoid alcoholic beverages for 24 hours.  ACTIVITY:  You should plan to take it easy for the rest of today and you should NOT DRIVE or use heavy machinery until tomorrow (because of the sedation medicines used during the test).    FOLLOW UP: Our staff will call the number listed on your records the next business day following your procedure.  We will call around 7:15- 8:00 am to check on you and address any questions or concerns that you may have regarding the information given to you following your procedure. If we do not reach you, we will leave a message.     If any biopsies were taken you will be contacted by phone or by letter within the next 1-3 weeks.  Please call us at [phone] if you have not heard about the biopsies in 3 weeks.    SIGNATURES/CONFIDENTIALITY: You and/or your care partner have signed paperwork which will be entered into your electronic medical record.  These signatures attest to the fact that that the information above on your After Visit Summary has been reviewed and is understood.  Full responsibility of the confidentiality of this discharge information lies with you and/or your care-partner.

## 2023-06-29 NOTE — Op Note (Addendum)
 [facility] Patient Name: Todd Williams Procedure Date: 06/29/2023 9:08 AM MRN: [id number] Endoscopist: Maren Beach , MD, [id number] Age: 48 Referring MD:  Date of Birth: July 22, 1975 Gender: Male Account #: [id number] Procedure:                Colonoscopy Indications:              Screening for colorectal malignant neoplasm, This                            is the patient's first colonoscopy Medicines:                Monitored Anesthesia Care Procedure:                Pre-Anesthesia Assessment:                           - Prior to the procedure, a History and Physical                            was performed, and patient medications and                            allergies were reviewed. The patient's tolerance of                            previous anesthesia was also reviewed. The risks                            and benefits of the procedure and the sedation                            options and risks were discussed with the patient.                            All questions were answered, and informed consent                            was obtained. Prior Anticoagulants: The patient has                            taken no anticoagulant or antiplatelet agents. ASA                            Grade Assessment: III - A patient with severe                            systemic disease. After reviewing the risks and                            benefits, the patient was deemed in satisfactory                            condition to undergo the procedure.  After obtaining informed consent, the colonoscope                            was passed under direct vision. Throughout the                            procedure, the patient's blood pressure, pulse, and                            oxygen saturations were monitored continuously.The                            colonoscopy was performed without difficulty. The                            patient tolerated the  procedure well. The quality                            of the bowel preparation was adequate. The                            ileocecal valve, appendiceal orifice, and rectum                            were photographed. The CF [id number] #[id number] was                            introduced through the anus and advanced to the                            terminal ileum. Scope In: 10:11:17 AM Scope Out: 10:27:20 AM Scope Withdrawal Time: 0 hours 14 minutes 20 seconds  Total Procedure Duration: 0 hours 16 minutes 3 seconds  Findings:                 The perianal and digital rectal examinations were                            normal. Pertinent negatives include normal                            sphincter tone and no palpable rectal lesions.                           Small-mouthed diverticula were found in the sigmoid                            colon and descending colon.                           A 4 mm polyp was found in the rectum. The polyp was                            sessile. The polyp was removed with a cold snare.  Resection and retrieval were complete.                           A 4 mm polyp was found in the rectum. The polyp was                            sessile. The polyp was removed with a cold snare.                            Resection and retrieval were complete.                           Internal hemorrhoids were found during retroflexion.                           The terminal ileum appeared normal. Complications:            No immediate complications. Estimated blood loss:                            Minimal. Estimated Blood Loss:     Estimated blood loss was minimal. Impression:               - Diverticulosis in the sigmoid colon and in the                            descending colon.                           - One 4 mm polyp in the rectum, removed with a cold                            snare. Resected and retrieved.                           - One 4  mm polyp in the rectum, removed with a cold                            snare. Resected and retrieved.                           - Internal hemorrhoids. Recommendation:           - Discharge patient to home (ambulatory).                           - Await pathology results.                           - Repeat colonoscopy for surveillance based on                            pathology results.                           - The findings and recommendations were discussed  with the patient's family.                           - Return to referring physician.                           - Patient has a contact number available for                            emergencies. The signs and symptoms of potential                            delayed complications were discussed with the                            patient. Return to normal activities tomorrow.                            Written discharge instructions were provided to the                            patient. Maren Beach, MD 06/29/2023 10:30:59 AM This report has been signed electronically. Addendum Number: 1   Addendum Date: 06/29/2023 5:56:34 PM      Correction: one rectal polyp was removed with cold snare and the other       rectal polyp was removed with cold biopsy forceps Maren Beach, MD 06/29/2023 5:57:20 PM This report has been signed electronically.

## 2023-06-30 ENCOUNTER — Telehealth: Payer: Self-pay

## 2023-06-30 NOTE — Telephone Encounter (Signed)
  Follow up Call-     06/29/2023    9:11 AM  Call back number  Post procedure Call Back phone  # [phone]  Permission to leave phone message Yes     Patient questions:  Do you have a fever, pain , or abdominal swelling? No. Pain Score  0 *  Have you tolerated food without any problems? Yes.    Have you been able to return to your normal activities? Yes.    Do you have any questions about your discharge instructions: Diet   No. Medications  No. Follow up visit  No.  Do you have questions or concerns about your Care? No.  Actions: * If pain score is 4 or above: No action needed, pain <4.

## 2023-07-01 LAB — SURGICAL PATHOLOGY

## 2023-07-04 ENCOUNTER — Encounter: Payer: Self-pay | Admitting: Pediatrics
